# Patient Record
Sex: Female | Born: 1985 | Race: Black or African American | Hispanic: No | Marital: Single | Smoking: Never smoker
Health system: Southern US, Community
[De-identification: ages and names within clinical notes are randomized; demographics above are authoritative.]

## PROBLEM LIST (undated history)

## (undated) DIAGNOSIS — K449 Diaphragmatic hernia without obstruction or gangrene: Secondary | ICD-10-CM

## (undated) DIAGNOSIS — E669 Obesity, unspecified: Secondary | ICD-10-CM

## (undated) DIAGNOSIS — Z8742 Personal history of other diseases of the female genital tract: Secondary | ICD-10-CM

## (undated) DIAGNOSIS — E66811 Obesity, class 1: Secondary | ICD-10-CM

## (undated) DIAGNOSIS — O34219 Maternal care for unspecified type scar from previous cesarean delivery: Secondary | ICD-10-CM

## (undated) HISTORY — DX: Obesity, class 1: E66.811

## (undated) HISTORY — DX: Maternal care for unspecified type scar from previous cesarean delivery: O34.219

## (undated) HISTORY — PX: COLPOSCOPY: SHX161

## (undated) HISTORY — DX: Personal history of other diseases of the female genital tract: Z87.42

## (undated) HISTORY — DX: Diaphragmatic hernia without obstruction or gangrene: K44.9

## (undated) HISTORY — DX: Obesity, unspecified: E66.9

---

## 2006-03-30 ENCOUNTER — Emergency Department: Payer: Self-pay | Admitting: Internal Medicine

## 2006-09-20 ENCOUNTER — Emergency Department: Payer: Self-pay

## 2006-11-14 ENCOUNTER — Inpatient Hospital Stay: Payer: Self-pay | Admitting: Obstetrics and Gynecology

## 2006-11-14 DIAGNOSIS — O321XX0 Maternal care for breech presentation, not applicable or unspecified: Secondary | ICD-10-CM

## 2007-01-12 ENCOUNTER — Emergency Department: Payer: Self-pay | Admitting: Unknown Physician Specialty

## 2007-01-15 ENCOUNTER — Emergency Department: Payer: Self-pay | Admitting: Emergency Medicine

## 2012-01-02 ENCOUNTER — Emergency Department: Payer: Self-pay | Admitting: Unknown Physician Specialty

## 2012-05-05 ENCOUNTER — Emergency Department: Payer: Self-pay | Admitting: Emergency Medicine

## 2012-05-05 LAB — PREGNANCY, URINE: Pregnancy Test, Urine: NEGATIVE m[IU]/mL

## 2013-10-26 IMAGING — CT CT HEAD WITHOUT CONTRAST
2 series · 15 of 30 positions shown, 19 images · non-contrast
Comparison: none

REASON FOR EXAM: headache for 8 days, no prior headache like this   flex 4
COMMENTS:   LMP: Negative Beta HCG

[Series 2: without · axial · non-contrast · 0.40mm/px · z∈[+326,+446]mm · 13 of 28 slices shown, 17 images]
[im 2/28  brain]
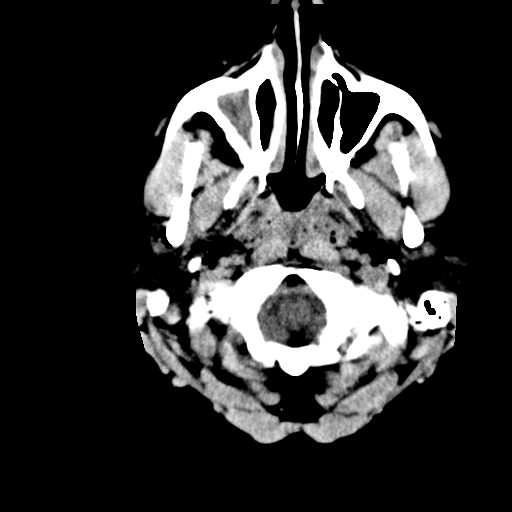
[im 2/28  bone]
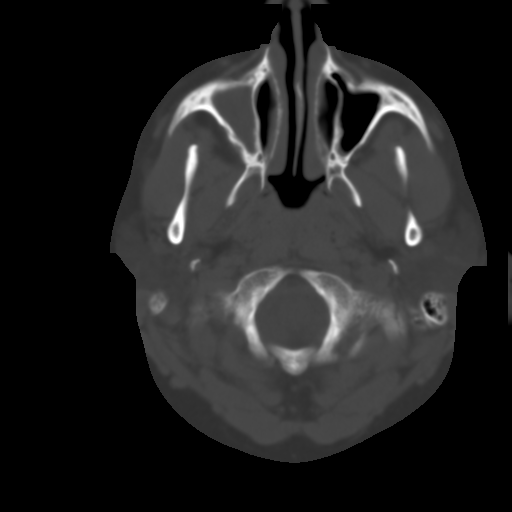
[im 4/28  brain]
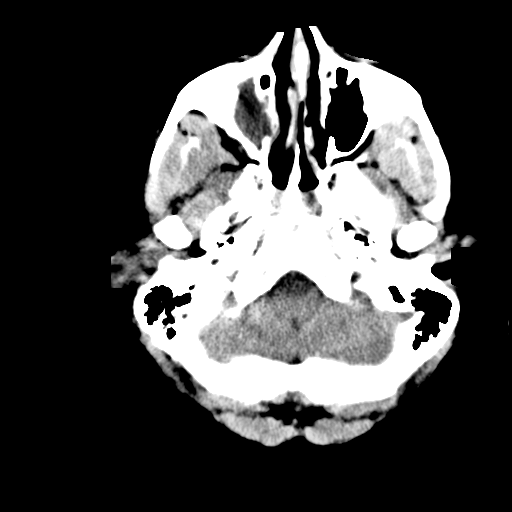
[im 6/28  brain]
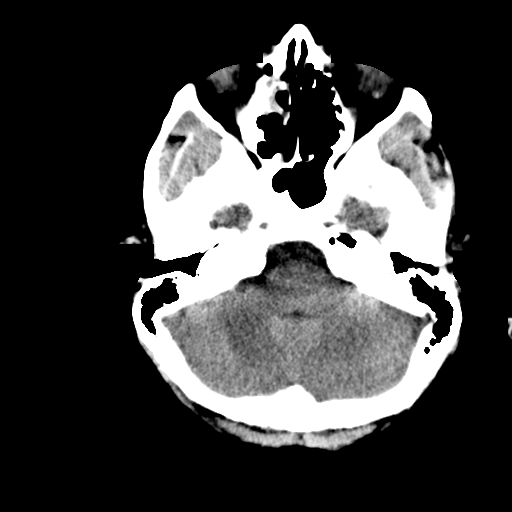
[im 8/28  brain]
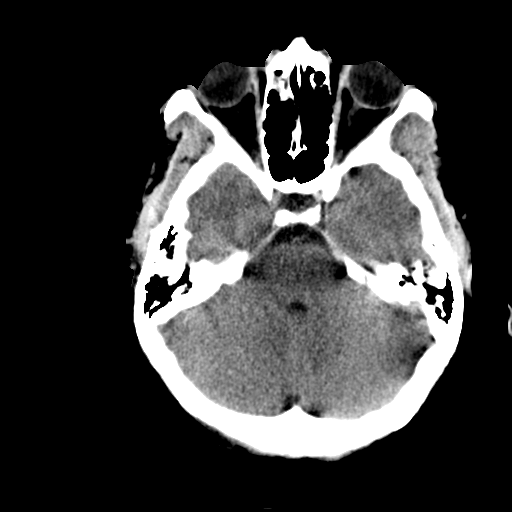
[im 10/28  brain]
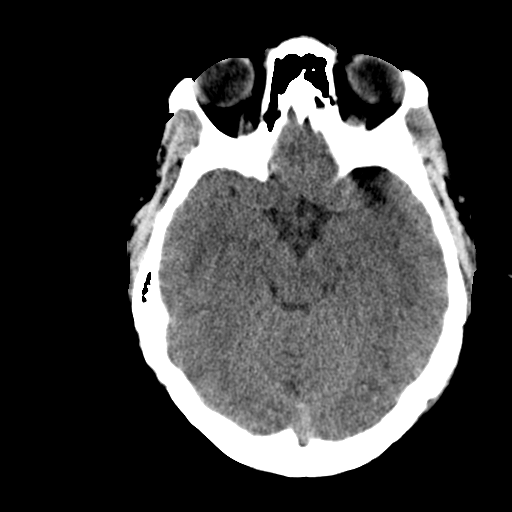
[im 10/28  bone]
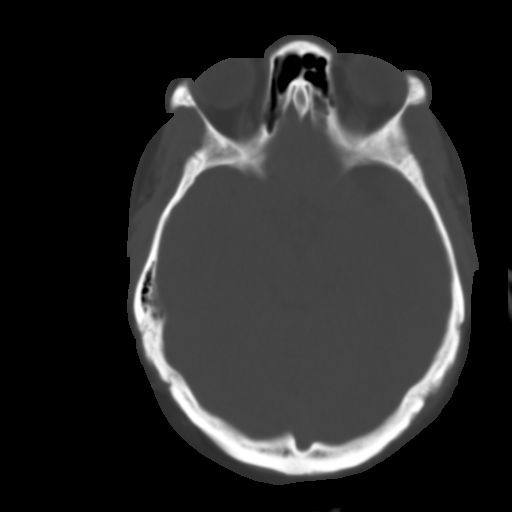
[im 12/28  brain]
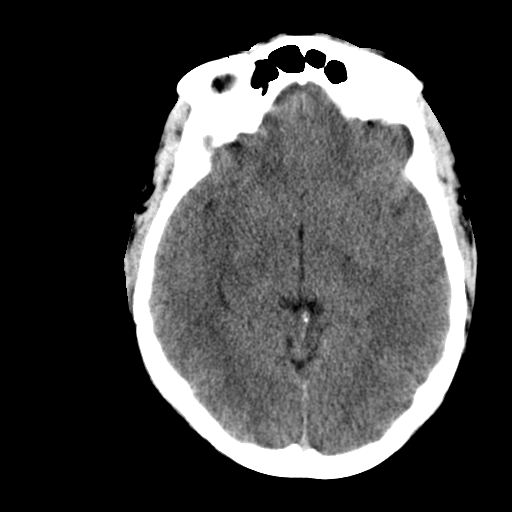
[im 14/28  brain]
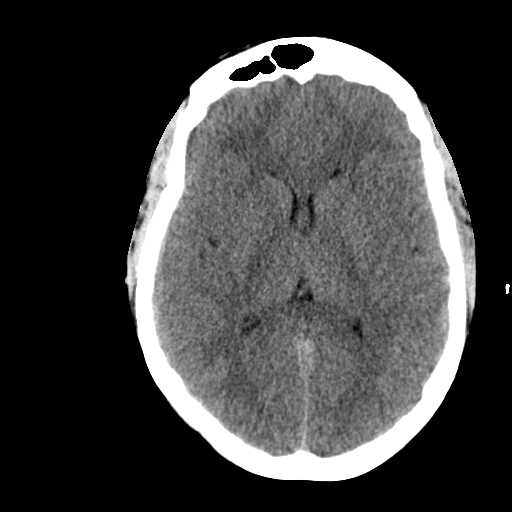
[im 16/28  brain]
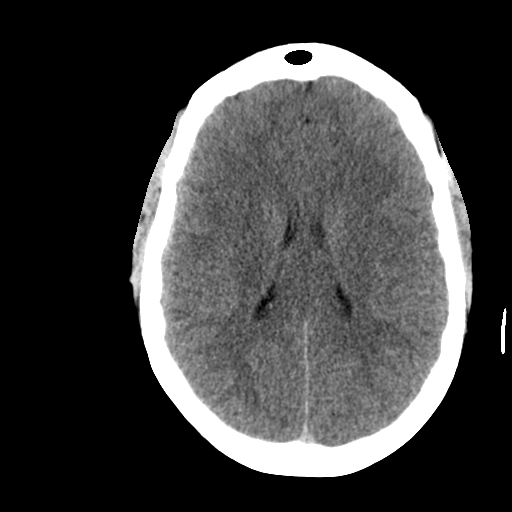
[im 18/28  brain]
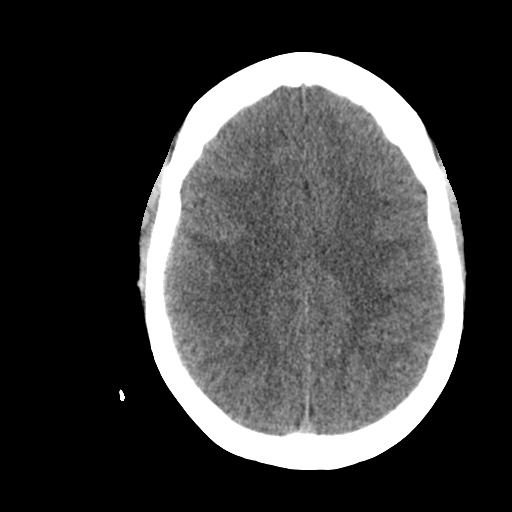
[im 18/28  bone]
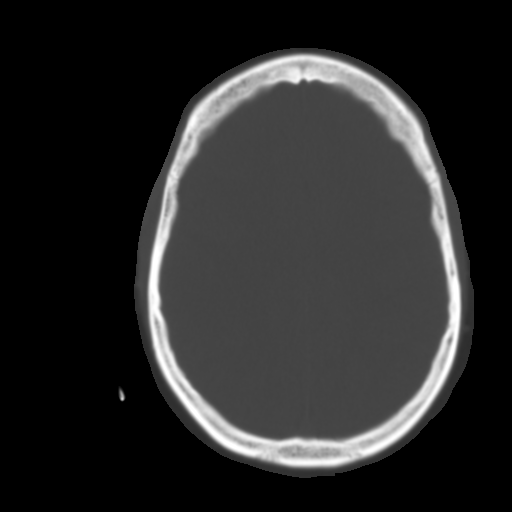
[im 20/28  brain]
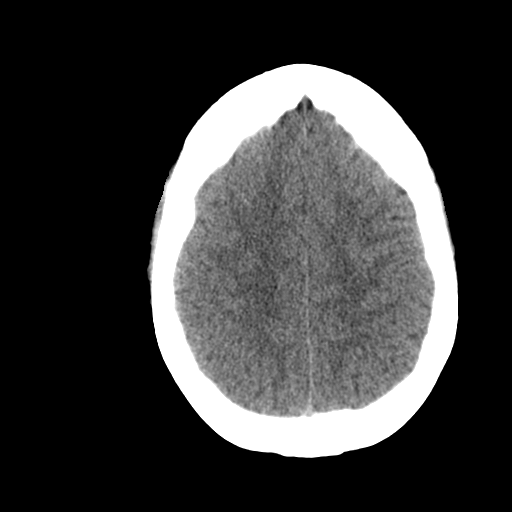
[im 22/28  brain]
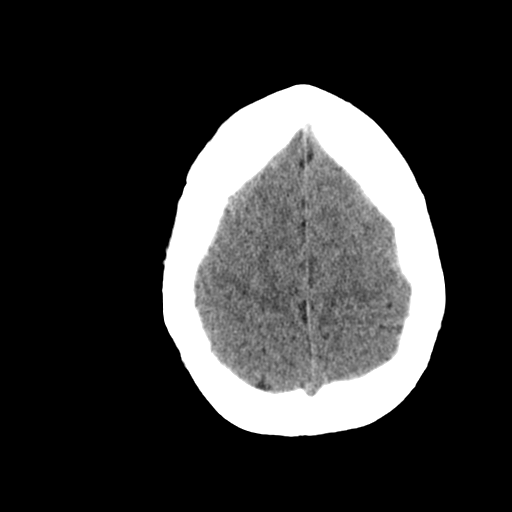
[im 24/28  brain]
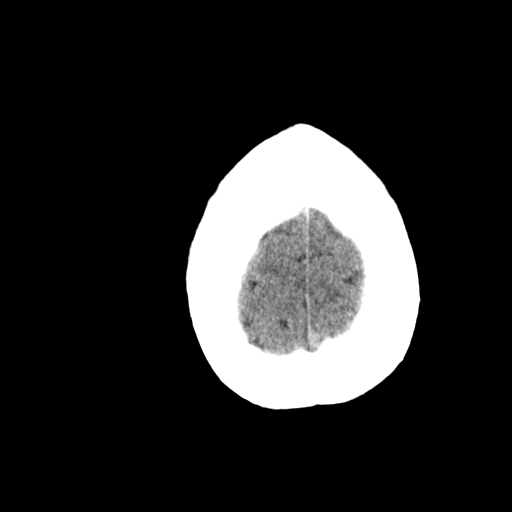
[im 26/28  brain]
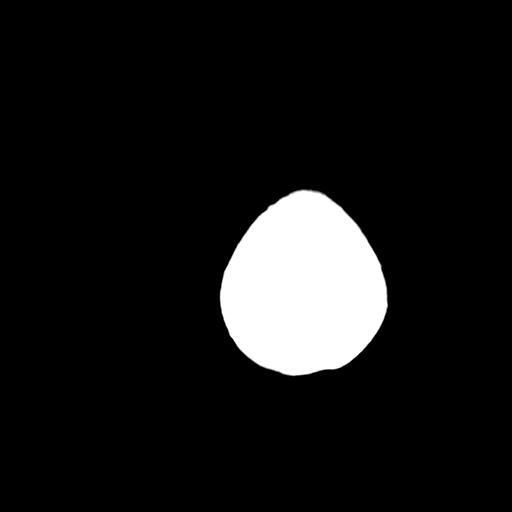
[im 26/28  bone]
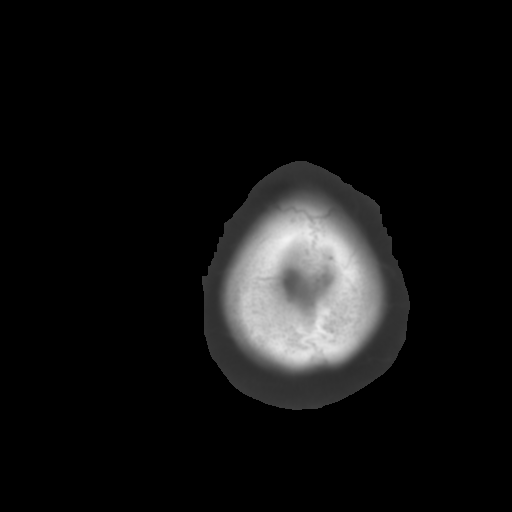

[Series 3: bone · axial · 0.40mm/px · z∈[+326,+346]mm · 2 of 28 slices shown]
[im 2/28  bone]
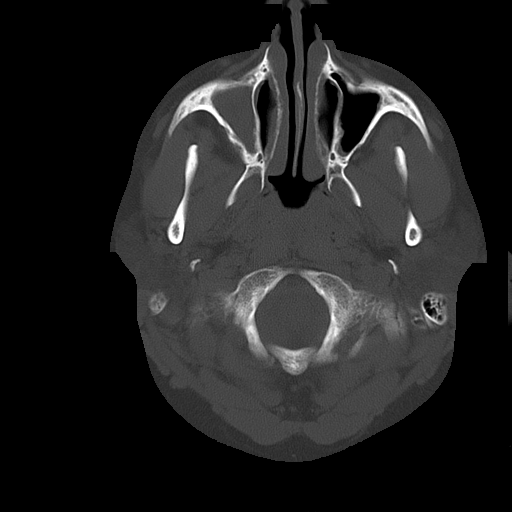
[im 6/28  bone]
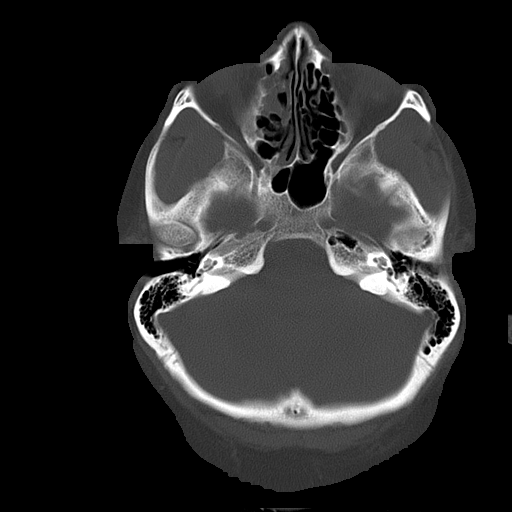

[15 of 30 positions shown; findings below may reference images not displayed]

PROCEDURE:     CT  - CT HEAD WITHOUT CONTRAST  - May 05, 2012  [DATE]

RESULT:     Emergent noncontrast CT of the brain is performed in the
standard fashion. The patient has no previous exam for comparison.

The ventricles and sulci appear to be normal. There is complete
opacification of the right maxillary sinus with some surrounding thickening
of the wall of the sinus suggestive of chronic sinusitis. Partial
opacification is seen in the right ethmoid region. The sphenoid sinuses and
frontal sinuses appear clear. The left maxillary sinus is clear. The mastoid
air cells show normal aeration. The calvarium is intact.

The ventricles and sulci are normal. There is no intracranial hemorrhage,
mass, mass effect or midline shift. There is no territorial infarct.
IMPRESSION: 1. Findings consistent with right maxillary sinusitis. Correlate clinically.
There slight thickening of the maxillary walls which suggest chronic
component.

[REDACTED]

## 2014-01-23 HISTORY — PX: ESOPHAGOGASTRODUODENOSCOPY: SHX1529

## 2014-10-13 DIAGNOSIS — R634 Abnormal weight loss: Secondary | ICD-10-CM | POA: Insufficient documentation

## 2015-11-26 HISTORY — PX: ABDOMINOPLASTY: SUR9

## 2017-08-19 ENCOUNTER — Ambulatory Visit: Payer: Self-pay | Admitting: Advanced Practice Midwife

## 2017-11-04 ENCOUNTER — Encounter: Payer: Self-pay | Admitting: Certified Nurse Midwife

## 2017-11-04 ENCOUNTER — Ambulatory Visit (INDEPENDENT_AMBULATORY_CARE_PROVIDER_SITE_OTHER): Payer: Commercial Managed Care - PPO | Admitting: Certified Nurse Midwife

## 2017-11-04 ENCOUNTER — Ambulatory Visit: Payer: Self-pay | Admitting: Certified Nurse Midwife

## 2017-11-04 VITALS — BP 112/72 | HR 63 | Ht 64.0 in | Wt 187.0 lb

## 2017-11-04 DIAGNOSIS — Z1329 Encounter for screening for other suspected endocrine disorder: Secondary | ICD-10-CM | POA: Diagnosis not present

## 2017-11-04 DIAGNOSIS — Z131 Encounter for screening for diabetes mellitus: Secondary | ICD-10-CM | POA: Diagnosis not present

## 2017-11-04 DIAGNOSIS — N852 Hypertrophy of uterus: Secondary | ICD-10-CM | POA: Diagnosis not present

## 2017-11-04 DIAGNOSIS — Z87898 Personal history of other specified conditions: Secondary | ICD-10-CM | POA: Diagnosis not present

## 2017-11-04 DIAGNOSIS — Z13220 Encounter for screening for lipoid disorders: Secondary | ICD-10-CM

## 2017-11-04 DIAGNOSIS — R87810 Cervical high risk human papillomavirus (HPV) DNA test positive: Secondary | ICD-10-CM

## 2017-11-04 DIAGNOSIS — K449 Diaphragmatic hernia without obstruction or gangrene: Secondary | ICD-10-CM | POA: Insufficient documentation

## 2017-11-04 DIAGNOSIS — Z01419 Encounter for gynecological examination (general) (routine) without abnormal findings: Secondary | ICD-10-CM | POA: Diagnosis not present

## 2017-11-04 DIAGNOSIS — R87610 Atypical squamous cells of undetermined significance on cytologic smear of cervix (ASC-US): Secondary | ICD-10-CM | POA: Insufficient documentation

## 2017-11-04 DIAGNOSIS — Z8742 Personal history of other diseases of the female genital tract: Secondary | ICD-10-CM | POA: Insufficient documentation

## 2017-11-04 NOTE — Progress Notes (Signed)
Gynecology Annual Exam  PCP: Patient, No Pcp Per  Chief Complaint:  Chief Complaint  Patient presents with  . Gynecologic Exam    History of Present Illness:History Of Present Illness  Ariana Waters presents today for her NP annual exam. She is a 31 year old Black/African American female , G3 P1021 , whose LMP was 11/03/2017. She is having no significant GYN problems. Her menses are regular. They occur every month , they last 6 days , are medium flow, and are without clots. She has had no spotting. She denies dysmenorrhea.   The patient's past medical history is remarkable for a history of a Cesarean section and an abdominoplasty She has had sex once in the last year. She uses nothing for contraception  Her most recent pap smear was obtained 06/19/2012 and was ASCUS with positive HRHPV. She has not had a colposcopy or another Pap smear since then Mammogram is not applicable. There is a positive family history of breast cancer in her maternal aunt. She does not think genetic testing was done on her aunt. There is no family history of ovarian cancer. The patient does not do monthly self breast exams.  The patient does not smoke.  The patient does drink infrequently.  The patient does not use illegal drugs.  The patient exercises regularly. She has lost 36# since her last annual. Current BMI is 32.10 kg/m2  The patient may not get adequate calcium in her diet. She is lactose intolerant She has not had a recent cholesterol screen and is interested in labwork.          Review of Systems: Review of Systems  Constitutional: Negative for chills, fever and weight loss.  HENT: Negative for congestion, sinus pain and sore throat.   Eyes: Negative for blurred vision and pain.  Respiratory: Negative for hemoptysis, shortness of breath and wheezing.   Cardiovascular: Negative for chest pain, palpitations and leg swelling.  Gastrointestinal: Negative for abdominal pain, blood in stool,  diarrhea, heartburn, nausea and vomiting.  Genitourinary: Negative for dysuria, frequency, hematuria and urgency.  Musculoskeletal: Negative for back pain, joint pain and myalgias.  Skin: Negative for itching and rash.  Neurological: Negative for dizziness, tingling and headaches.  Endo/Heme/Allergies: Negative for environmental allergies and polydipsia. Does not bruise/bleed easily.       Negative for hirsutism   Psychiatric/Behavioral: Negative for depression. The patient is not nervous/anxious and does not have insomnia.     Past Medical History:  Past Medical History:  Diagnosis Date  . Hiatal hernia   . History of abnormal cervical Pap smear 2010/ 2013    Past Surgical History:  Past Surgical History:  Procedure Laterality Date  . ABDOMINOPLASTY  2017  . CESAREAN SECTION  11/14/2006  . COLPOSCOPY  ?2010  . ESOPHAGOGASTRODUODENOSCOPY  01/2014    Family History:  Family History  Problem Relation Age of Onset  . Breast cancer Maternal Aunt 58  . Cancer Paternal Aunt        unknown primary/ paternal great aunt  . Thyroid disease Maternal Aunt   . Stroke Neg Hx     Social History:  Social History   Socioeconomic History  . Marital status: Single    Spouse name: Not on file  . Number of children: 1  . Years of education: Not on file  . Highest education level: Not on file  Social Needs  . Financial resource strain: Not on file  . Food insecurity - worry: Not  on file  . Food insecurity - inability: Not on file  . Transportation needs - medical: Not on file  . Transportation needs - non-medical: Not on file  Occupational History  . Occupation: assembly line  Tobacco Use  . Smoking status: Never Smoker  . Smokeless tobacco: Never Used  Substance and Sexual Activity  . Alcohol use: Yes    Frequency: Never    Comment: rare  . Drug use: No  . Sexual activity: Yes    Partners: Male    Birth control/protection: Abstinence  Other Topics Concern  . Not on file    Social History Narrative  . Not on file    Allergies:  Allergies  Allergen Reactions  . Penicillins Swelling    Swelling and hives on face  . Lemon Oil     Medications: none  Physical Exam Vitals: BP 112/72   Pulse 63   Ht 5\' 4"  (1.626 m)   Wt 187 lb (84.8 kg)   LMP 11/03/2017 (Exact Date)   BMI 32.10 kg/m .  General: AAF in NAD HEENT: normocephalic, anicteric Neck: no thyroid enlargement, no palpable nodules, no cervical lymphadenopathy  Pulmonary: No increased work of breathing, CTAB Cardiovascular: RRR, without murmur  Breast: Breast symmetrical, no tenderness, no palpable nodules or masses, no skin or nipple retraction present, no nipple discharge.  No axillary, infraclavicular or supraclavicular lymphadenopathy. Abdomen: Soft, non-tender, non-distended.  Umbilicus without lesions.  No hepatomegaly or masses palpable. No evidence of hernia. Genitourinary:  External: Normal external female genitalia.  Normal urethral meatus, normal Bartholin's and Skene's glands.    Vagina: moderate vaginal bleeding-on menses   Cervix: not visualized due to blood  Uterus: Anteverted, irregular shape, mobile, and non-tender  Adnexa: No adnexal masses, non-tender  Rectal: deferred  Lymphatic: no evidence of inguinal lymphadenopathy Extremities: no edema, erythema, or tenderness Neurologic: Grossly intact Psychiatric: mood appropriate, affect full     Assessment: 31 y.o. annual NP gyn exam Irregular shaped uterus R/O fibroids ASCUS Pap with HRHPV in 2013-last Pap  Unable to do Pap due to menses  Plan:  1) Breast cancer screening - recommend monthly self breast exam. Reviewed how to do self breast exam  2) Pelvic ultrasound in 2 weeks and follow up for Pap  3) Contraception - abstinence for now  4) Routine healthcare maintenance including cholesterol and diabetes screening ordered today, along with TSH  5) Discussed calcium and vitamin D3 requirements.  Ariana Waters  Ariana Waters, CNM

## 2017-11-04 NOTE — Patient Instructions (Signed)
Will see you back in a few weeks for a pelvic ultrasound and a PAP smear.

## 2017-11-05 LAB — TSH: TSH: 3.13 u[IU]/mL (ref 0.450–4.500)

## 2017-11-05 LAB — LIPID PANEL WITH LDL/HDL RATIO
CHOLESTEROL TOTAL: 160 mg/dL (ref 100–199)
HDL: 59 mg/dL (ref >39)
LDL Calculated: 89 mg/dL (ref 0–99)
LDl/HDL Ratio: 1.5 ratio (ref 0.0–3.2)
Triglycerides: 59 mg/dL (ref 0–149)
VLDL Cholesterol Cal: 12 mg/dL (ref 5–40)

## 2017-11-05 LAB — HGB A1C W/O EAG: Hgb A1c MFr Bld: 4.7 % — ABNORMAL LOW (ref 4.8–5.6)

## 2017-11-11 ENCOUNTER — Encounter (INDEPENDENT_AMBULATORY_CARE_PROVIDER_SITE_OTHER): Payer: Self-pay

## 2017-11-12 ENCOUNTER — Encounter: Payer: Self-pay | Admitting: Certified Nurse Midwife

## 2017-11-26 ENCOUNTER — Ambulatory Visit: Payer: Commercial Managed Care - PPO | Admitting: Certified Nurse Midwife

## 2017-11-26 ENCOUNTER — Other Ambulatory Visit: Payer: Commercial Managed Care - PPO

## 2017-11-26 ENCOUNTER — Encounter (INDEPENDENT_AMBULATORY_CARE_PROVIDER_SITE_OTHER): Payer: Self-pay

## 2017-12-31 ENCOUNTER — Encounter: Payer: Self-pay | Admitting: Certified Nurse Midwife

## 2017-12-31 ENCOUNTER — Ambulatory Visit (INDEPENDENT_AMBULATORY_CARE_PROVIDER_SITE_OTHER): Payer: Commercial Managed Care - PPO | Admitting: Certified Nurse Midwife

## 2017-12-31 ENCOUNTER — Ambulatory Visit (INDEPENDENT_AMBULATORY_CARE_PROVIDER_SITE_OTHER): Payer: Commercial Managed Care - PPO

## 2017-12-31 VITALS — BP 102/72 | HR 67 | Ht 64.0 in | Wt 176.0 lb

## 2017-12-31 DIAGNOSIS — R87810 Cervical high risk human papillomavirus (HPV) DNA test positive: Secondary | ICD-10-CM

## 2017-12-31 DIAGNOSIS — N852 Hypertrophy of uterus: Secondary | ICD-10-CM | POA: Diagnosis not present

## 2017-12-31 DIAGNOSIS — R87610 Atypical squamous cells of undetermined significance on cytologic smear of cervix (ASC-US): Secondary | ICD-10-CM | POA: Diagnosis not present

## 2017-12-31 DIAGNOSIS — Z124 Encounter for screening for malignant neoplasm of cervix: Secondary | ICD-10-CM

## 2017-12-31 NOTE — Progress Notes (Signed)
  HPI: 32 year old G1 70P1 female presents for follow up visit. She had her annual exam done in December and her uterus felt enlarged and irregular in shape. In addition she was unable to have her Pap done because she was on her menses. She is here today for a follow up after her pelvic ultrasound and to have her Pap done. Last Pap smear in 2013 was abnormal (ASCUS with positive HRHPV).  Ultrasound demonstrates a uterus measuring 9.4 x 5.5 x 5.2 cm. No fibroids were seen. No ovarian masses were seen.   PMHx: She  has a past medical history of Hiatal hernia and History of abnormal cervical Pap smear (2010/ 2013). Also,  has a past surgical history that includes Colposcopy (?2010); Cesarean section (11/14/2006); Abdominoplasty (2017); and Esophagogastroduodenoscopy (01/2014)., family history includes Breast cancer (age of onset: 6758) in her maternal aunt; Cancer in her paternal aunt; Thyroid disease in her maternal aunt.,  reports that  has never smoked. she has never used smokeless tobacco. She reports that she drinks alcohol. She reports that she does not use drugs.  She has a current medication list which includes the following prescription(s): multi-vitamins. Also, is allergic to penicillins and lemon oil.  ROS  Objective: BP 102/72   Pulse 67   Ht 5\' 4"  (1.626 m)   Wt 176 lb (79.8 kg)   LMP 12/20/2017 (Exact Date)   BMI 30.21 kg/m   Physical examination Constitutional NAD, Conversant      Extremities: Moves all appropriately.  Normal ROM for age. No lymphadenopathy.  Neuro: Grossly intact  Psych: Oriented to PPT.  Normal mood. Normal affect.  Pelvic:  Vulva: no lesions or inflammation Vagina: small amount off white mucoid discharge Cervix: small, no lesions, nullip Pap done   Assessment/ Plan: normal pelvic ultrasound Hx of abnormal Pap smear  Pap done  RTO 1 year if Pap smear is normal.  Farrel Connersolleen Season Astacio, CNM

## 2018-01-03 LAB — IGP, APTIMA HPV
HPV Aptima: POSITIVE — AB
PAP SMEAR COMMENT: 0

## 2018-01-07 ENCOUNTER — Encounter: Payer: Self-pay | Admitting: Certified Nurse Midwife

## 2018-01-07 ENCOUNTER — Telehealth: Payer: Self-pay | Admitting: Certified Nurse Midwife

## 2018-01-07 DIAGNOSIS — R87810 Cervical high risk human papillomavirus (HPV) DNA test positive: Principal | ICD-10-CM

## 2018-01-07 DIAGNOSIS — R87610 Atypical squamous cells of undetermined significance on cytologic smear of cervix (ASC-US): Secondary | ICD-10-CM

## 2018-01-07 DIAGNOSIS — R87612 Low grade squamous intraepithelial lesion on cytologic smear of cervix (LGSIL): Secondary | ICD-10-CM

## 2018-01-07 NOTE — Telephone Encounter (Signed)
Patient called with results of Pap smear: LGSIL with positive HRHPV. Prior PAP ASCUS with positive HRHPV. Recommended colposcopy after discussing findings. Will schedule with Dr Jerene PitchSchuman. Farrel Connersolleen Phelan Goers, CNM

## 2018-02-20 ENCOUNTER — Encounter: Payer: Self-pay | Admitting: Obstetrics and Gynecology

## 2018-02-20 ENCOUNTER — Ambulatory Visit (INDEPENDENT_AMBULATORY_CARE_PROVIDER_SITE_OTHER): Payer: Commercial Managed Care - PPO | Admitting: Obstetrics and Gynecology

## 2018-02-20 VITALS — BP 100/60 | HR 73 | Ht 64.0 in | Wt 179.0 lb

## 2018-02-20 DIAGNOSIS — Z87898 Personal history of other specified conditions: Secondary | ICD-10-CM

## 2018-02-20 DIAGNOSIS — Z8742 Personal history of other diseases of the female genital tract: Secondary | ICD-10-CM

## 2018-02-20 DIAGNOSIS — R87612 Low grade squamous intraepithelial lesion on cytologic smear of cervix (LGSIL): Secondary | ICD-10-CM | POA: Diagnosis not present

## 2018-02-20 NOTE — Progress Notes (Signed)
   GYNECOLOGY CLINIC COLPOSCOPY PROCEDURE NOTE  32 y.o. [id number] here for colposcopy for low-grade squamous intraepithelial neoplasia (LGSIL - encompassing HPV,mild dysplasia,CIN I)  pap smear on 12/31/2017. Discussed underlying role for HPV infection in the development of cervical dysplasia, its natural history and progression/regression, need for surveillance.  Is the patient  pregnant: No LMP: Patient's last menstrual period was 02/12/2018. Smoking status:  reports that she has never smoked. She has never used smokeless tobacco. Number of partners in lifetime:  Less than 10 Future fertility desired:  Yes  Patient given informed consent, signed copy in the chart, time out was performed.  The patient was position in dorsal lithotomy position. Speculum was placed the cervix was visualized.   After application of acetic acid colposcopic inspection of the cervix was undertaken.   Colposcopy adequate, full visualization of transformation zone: Yes Mosaic changes at 2 and 9 o'clock and abnormal vessels noted at 9 o'clock; corresponding biopsies obtained.   ECC specimen obtained:  Yes  All specimens were labeled and sent to pathology.   Patient was given post procedure instructions.  Will follow up pathology and manage accordingly.  Routine preventative health maintenance measures emphasized.  Physical Exam  Genitourinary:      Ariana Idlerhristanna Vaness Jelinski MD [facility], [facility] 02/20/18 2:55 PM

## 2018-02-24 ENCOUNTER — Encounter: Payer: Self-pay | Admitting: Obstetrics and Gynecology

## 2018-02-24 LAB — PATHOLOGY

## 2018-02-24 NOTE — Progress Notes (Signed)
No CIN. Repeat pap with cotesting in 1 year. Called, no answer, left message to check mychart.

## 2018-03-16 ENCOUNTER — Ambulatory Visit: Payer: Commercial Managed Care - PPO | Admitting: Obstetrics and Gynecology

## 2018-04-21 ENCOUNTER — Ambulatory Visit (INDEPENDENT_AMBULATORY_CARE_PROVIDER_SITE_OTHER): Payer: Commercial Managed Care - PPO | Admitting: Certified Nurse Midwife

## 2018-04-21 ENCOUNTER — Other Ambulatory Visit (INDEPENDENT_AMBULATORY_CARE_PROVIDER_SITE_OTHER): Payer: Commercial Managed Care - PPO

## 2018-04-21 ENCOUNTER — Encounter: Payer: Self-pay | Admitting: Certified Nurse Midwife

## 2018-04-21 VITALS — BP 100/60 | Ht 64.0 in | Wt 185.0 lb

## 2018-04-21 DIAGNOSIS — E669 Obesity, unspecified: Secondary | ICD-10-CM

## 2018-04-21 DIAGNOSIS — Z3480 Encounter for supervision of other normal pregnancy, unspecified trimester: Secondary | ICD-10-CM

## 2018-04-21 DIAGNOSIS — Z3201 Encounter for pregnancy test, result positive: Secondary | ICD-10-CM | POA: Diagnosis not present

## 2018-04-21 DIAGNOSIS — Z113 Encounter for screening for infections with a predominantly sexual mode of transmission: Secondary | ICD-10-CM

## 2018-04-21 DIAGNOSIS — O3680X0 Pregnancy with inconclusive fetal viability, not applicable or unspecified: Secondary | ICD-10-CM

## 2018-04-21 DIAGNOSIS — Z3A01 Less than 8 weeks gestation of pregnancy: Secondary | ICD-10-CM | POA: Diagnosis not present

## 2018-04-21 DIAGNOSIS — N926 Irregular menstruation, unspecified: Secondary | ICD-10-CM

## 2018-04-21 DIAGNOSIS — R87612 Low grade squamous intraepithelial lesion on cytologic smear of cervix (LGSIL): Secondary | ICD-10-CM

## 2018-04-21 DIAGNOSIS — Z9889 Other specified postprocedural states: Secondary | ICD-10-CM | POA: Insufficient documentation

## 2018-04-21 DIAGNOSIS — O34219 Maternal care for unspecified type scar from previous cesarean delivery: Secondary | ICD-10-CM

## 2018-04-21 LAB — POCT URINE PREGNANCY: Preg Test, Ur: POSITIVE — AB

## 2018-04-21 LAB — OB RESULTS CONSOLE VARICELLA ZOSTER ANTIBODY, IGG: Varicella: IMMUNE

## 2018-04-21 NOTE — Progress Notes (Signed)
New Obstetric Patient H&P    Chief Complaint: "Desires prenatal care"   History of Present Illness: Patient is a 32 y.o. G62P1021 Black female, LMP 03/03/2018 presents with amenorrhea and positive home pregnancy test. Based on her  LMP, her EDD is  12/08/18 and her EGA is [redacted]w[redacted]d. Cycles are 6 days, regular, and occur approximately every : 28 days. Her last pap smear was 3 or 4 months ago and was LGSIL with positive HRHPV. A colposcopy and biops wa done 02/20/2018 and pathology returned +HPV.Marland Kitchen    She had a urine pregnancy test which was positive 1 month(s)  ago. Her last menstrual period was normal and lasted for  6 day(s). Since her LMP she claims she has experienced breast tenderness and urinary frequency. She denies vaginal bleeding. Her past medical history is remarkable for obesity with a BMI=31-32, a history of an abdominoplasty, and a history of a hiatal hernia . Her prior pregnancies are notable for a previous Cesarean section for breech presentation at term.  Since her LMP, she admits to the use of tobacco products  No Since her LMP, she denies use of alcohol and non prescription drugs. She claims she has gained   9 pounds since the start of her pregnancy.  There are cats in the home in the home  no If yes NA She admits close contact with children on a regular basis  yes  She has had chicken pox in the past yes She has had Tuberculosis exposures, symptoms, or previously tested positive for TB   no Current or past history of domestic violence. no  Genetic Screening/Teratology Counseling: (Includes patient, baby's father, or anyone in either family with:)   1. Patient's age >/= 58 at [facility]  no 2. Thalassemia ([place], [place], Mediterranean, or Asian background): MCV<80  no 3. Neural tube defect (meningomyelocele, spina bifida, anencephaly)  no 4. Congenital heart defect  no  5. Down syndrome  no 6. Tay-Sachs (Jewish, [place]  no 7. Canavan's Disease  no 8. Sickle cell  disease or trait (African)  no  9. Hemophilia or other blood disorders  no  10. Muscular dystrophy  no  11. Cystic fibrosis  no  12. Huntington's Chorea  no  13. Mental retardation/autism  no 14. Other inherited genetic or chromosomal disorder  no 15. Maternal metabolic disorder (DM, PKU, etc)  no 16. Patient or FOB with a child with a birth defect not listed above no  16a. Patient or FOB with a birth defect themselves no 17. Recurrent pregnancy loss, or stillbirth  no  18. Any medications since LMP other than prenatal vitamins (include vitamins, supplements, OTC meds, drugs, alcohol)  Yes, Tylenol 19. Any other genetic/environmental exposure to discuss  no  Infection History:   1. Lives with someone with TB or TB exposed  no  2. Patient or partner has history of genital herpes  no 3. Rash or viral illness since LMP  no 4. History of STI (GC, CT, HPV, syphilis, HIV)  no 5. History of recent travel :  no  Other pertinent information:  Yes, patient works at [employer] on the [employer]. Her partner is Chrissie Noa, age 37     Review of Systems: Review of Systems  Constitutional: Positive for malaise/fatigue. Negative for chills, fever and weight loss.  HENT: Negative for congestion, sinus pain and sore throat.   Eyes: Negative for blurred vision and pain.  Respiratory: Negative for hemoptysis, shortness of breath and  wheezing.   Cardiovascular: Negative for chest pain, palpitations and leg swelling.  Gastrointestinal: Negative for abdominal pain, blood in stool, diarrhea, heartburn, nausea and vomiting.  Genitourinary: Positive for frequency. Negative for dysuria, hematuria and urgency.  Musculoskeletal: Negative for back pain, joint pain and myalgias.  Skin: Negative for itching and rash.  Neurological: Positive for headaches. Negative for dizziness and tingling.  Endo/Heme/Allergies: Negative for environmental allergies and polydipsia. Does not bruise/bleed easily.       Negative for  hirsutism. Positive for breast tenderness   Psychiatric/Behavioral: Negative for depression. The patient is not nervous/anxious and does not have insomnia.    Past Medical History:  Past Medical History:  Diagnosis Date  . Hiatal hernia   . History of abnormal cervical Pap smear 2010/ 2013    Past Surgical History:  Past Surgical History:  Procedure Laterality Date  . ABDOMINOPLASTY  2017  . CESAREAN SECTION  11/14/2006  . COLPOSCOPY  ?2010  . ESOPHAGOGASTRODUODENOSCOPY  01/2014    Gynecologic History: Patient's last menstrual period was 03/03/2018 (exact date).  Obstetric History: [id number] OB History  Gravida Para Term Preterm AB Living  SAB TAB Ectopic Multiple Live Births    2     1    # Outcome Date GA Lbr Len/2nd Weight Sex Delivery Anes PTL Lv  4 Current           3 Term 11/14/06 [redacted]w[redacted]d  6 lb (2.722 kg) M CS-LTranv   LIV     Complications: Breech presentation  2 TAB           1 TAB            Family History:  Family History  Problem Relation Age of Onset  . Breast cancer Maternal Aunt 58  . Cancer Paternal Aunt        unknown primary/ paternal great aunt  . Thyroid disease Maternal Aunt   . Stroke Neg Hx     Social History:  Social History   Socioeconomic History  . Marital status: Single    Spouse name: Not on file  . Number of children: 1  . Years of education: Not on file  . Highest education level: Not on file  Occupational History  . Occupation: Theatre stage manager  Social Needs  . Financial resource strain: Not on file  . Food insecurity:    Worry: Not on file    Inability: Not on file  . Transportation needs:    Medical: Not on file    Non-medical: Not on file  Tobacco Use  . Smoking status: Never Smoker  . Smokeless tobacco: Never Used  Substance and Sexual Activity  . Alcohol use: Yes    Frequency: Never    Comment: rare  . Drug use: No  . Sexual activity: Yes    Partners: Male    Birth control/protection: None  Lifestyle   . Physical activity:    Days per week: 4 days    Minutes per session: 110 min  . Stress: Rather much  Relationships  . Social connections:    Talks on phone: More than three times a week    Gets together: Once a week    Attends religious service: More than 4 times per year    Active member of club or organization: No    Attends meetings of clubs or organizations: Never    Relationship status: Never married  . Intimate partner violence:  Fear of current or ex partner: No    Emotionally abused: No    Physically abused: No    Forced sexual activity: No  Other Topics Concern  . Not on file  Social History Narrative  . Not on file    Allergies:  Allergies  Allergen Reactions  . Penicillins Swelling    Swelling and hives on face  . Lemon Oil Rash    Medications: Current Outpatient Medications on File Prior to Visit  Medication Sig Dispense Refill  . Multiple Vitamin (MULTI-VITAMINS) TABS Take by mouth.     No current facility-administered medications on file prior to visit.   Physical Exam Vitals: Blood pressure 100/60, weight 185 lb (83.9 kg), last menstrual period 03/03/2018.  General: BF in NAD HEENT: normocephalic, anicteric Thyroid: no enlargement, no palpable nodules Pulmonary: No increased work of breathing, CTAB Cardiovascular: RRR without murmur Abdomen: soft, non-tender, non-distended.  Umbilicus without lesions.  No hepatomegaly, or masses palpable. No evidence of hernia. Large scar on lower abdomen and around umbilicus from abdominoplasty Genitourinary:  External: Normal external female genitalia.  Normal urethral meatus, normal Bartholin's and Skene's glands.    Vagina: Normal vaginal mucosa, no evidence of prolapse.    Cervix: no bleeding, closed, NT  Uterus: AF, 6-8 week size, mobile, normal contour, NT  Adnexa: ovaries non-enlarged, no adnexal masses  Rectal: deferred Extremities: no edema, erythema, or tenderness Neurologic: Grossly  intact Psychiatric: mood appropriate, affect full   Assessment: 32 y.o. [id number] at [redacted]w[redacted]d presenting to initiate prenatal care Prior CS for breech presentation. Obesity (BMI 31-32 kg/m2) Hx of abnormal Pap smear  Plan: 1) Avoid alcoholic beverages. 2) Patient encouraged not to smoke.  3) Discontinue the use of all non-medicinal drugs and chemicals.  4) Take prenatal vitamins daily.  5) Nutrition, food safety (fish, cheese advisories, and high nitrite foods) and exercise discussed. Handouts given. 6) Hospital and practice style discussed with cross coverage system.  7) Genetic Screening, such as with 1st Trimester Screening, cell free fetal DNA, AFP testing, and Ultrasound, as well as carrier screening is discussed with patient. At the conclusion of today's visit patient requested genetic testing: most likely will do first trimester testing.  8) Patient is asked about travel to areas at risk for the Zika virus, and counseled to avoid travel and exposure to mosquitoes or sexual partners who may have themselves been exposed to the virus. Testing is discussed, and will be ordered as appropriate. 9) A TVUS for viability/ dating was done today: There was a singleton IUP with CRL=7wk, which confirms dates. FCA 141  10.) Discussed history of previous Cesarean section for breech presentation and options for trial of labor vs repeat Cesarean section. Explained risks of uterine rupture with a spontaneous labor (<1%) and the risk increases with induction of labor, especially with an unripe cervix. Risk of CS probably similar to first time mother (20-25%).  11) RTO in 3-4 weeks for DT check Schedule 1 hour GTT at that time. NOB labs today.Repeat Pap at 6 week pp check.  Farrel Conners, CNM

## 2018-04-22 LAB — URINE DRUG PANEL 7
AMPHETAMINES, URINE: NEGATIVE ng/mL
BENZODIAZEPINE QUANT UR: NEGATIVE ng/mL
Barbiturate Quant, Ur: NEGATIVE ng/mL
COCAINE (METAB.): NEGATIVE ng/mL
Cannabinoid Quant, Ur: NEGATIVE ng/mL
OPIATE QUANT UR: NEGATIVE ng/mL
PCP Quant, Ur: NEGATIVE ng/mL

## 2018-04-23 LAB — RPR+RH+ABO+RUB AB+AB SCR+CB...
Antibody Screen: NEGATIVE
HIV Screen 4th Generation wRfx: NONREACTIVE
Hematocrit: 36.4 % (ref 34.0–46.6)
Hemoglobin: 11.1 g/dL (ref 11.1–15.9)
Hepatitis B Surface Ag: NEGATIVE
MCH: 27.6 pg (ref 26.6–33.0)
MCHC: 30.5 g/dL — ABNORMAL LOW (ref 31.5–35.7)
MCV: 91 fL (ref 79–97)
Platelets: 273 10*3/uL (ref 150–450)
RBC: 4.02 x10E6/uL (ref 3.77–5.28)
RDW: 14.6 % (ref 12.3–15.4)
RPR Ser Ql: NONREACTIVE
RUBELLA: 1.15 {index} (ref >0.99)
Rh Factor: POSITIVE
VARICELLA: 1012 {index} (ref >165)
WBC: 9.2 10*3/uL (ref 3.4–10.8)

## 2018-04-23 LAB — HEMOGLOBINOPATHY EVALUATION
HEMOGLOBIN F QUANTITATION: 0 % (ref 0.0–2.0)
HGB A: 97.8 % (ref 96.4–98.8)
HGB C: 0 %
HGB S: 0 %
HGB VARIANT: 0 %
Hemoglobin A2 Quantitation: 2.2 % (ref 1.8–3.2)

## 2018-04-23 LAB — CHLAMYDIA/GONOCOCCUS/TRICHOMONAS, NAA
CHLAMYDIA BY NAA: NEGATIVE
GONOCOCCUS BY NAA: NEGATIVE
TRICH VAG BY NAA: NEGATIVE

## 2018-04-23 LAB — URINE CULTURE

## 2018-04-24 ENCOUNTER — Encounter: Payer: Self-pay | Admitting: Certified Nurse Midwife

## 2018-04-24 DIAGNOSIS — O0990 Supervision of high risk pregnancy, unspecified, unspecified trimester: Secondary | ICD-10-CM | POA: Insufficient documentation

## 2018-04-27 ENCOUNTER — Encounter: Payer: Self-pay | Admitting: Certified Nurse Midwife

## 2018-04-29 ENCOUNTER — Encounter: Payer: Self-pay | Admitting: Certified Nurse Midwife

## 2018-05-19 ENCOUNTER — Encounter: Payer: Self-pay | Admitting: Obstetrics and Gynecology

## 2018-05-19 ENCOUNTER — Ambulatory Visit (INDEPENDENT_AMBULATORY_CARE_PROVIDER_SITE_OTHER): Payer: Commercial Managed Care - PPO | Admitting: Obstetrics and Gynecology

## 2018-05-19 VITALS — BP 104/58 | Wt 194.0 lb

## 2018-05-19 DIAGNOSIS — O34219 Maternal care for unspecified type scar from previous cesarean delivery: Secondary | ICD-10-CM

## 2018-05-19 DIAGNOSIS — E669 Obesity, unspecified: Secondary | ICD-10-CM

## 2018-05-19 DIAGNOSIS — Z3A11 11 weeks gestation of pregnancy: Secondary | ICD-10-CM

## 2018-05-19 DIAGNOSIS — Z3491 Encounter for supervision of normal pregnancy, unspecified, first trimester: Secondary | ICD-10-CM

## 2018-05-19 DIAGNOSIS — E66811 Obesity, class 1: Secondary | ICD-10-CM

## 2018-05-19 NOTE — Progress Notes (Incomplete)
  Routine Prenatal Care Visit  Subjective  Ariana Waters is a 32 y.o. [phone] at Waters being seen today for ongoing prenatal care.  She is currently monitored for the following issues for this {Blank single:19197::"high-risk","low-risk"} pregnancy and has Atypical squamous cell changes of undetermined significance (ASCUS) on cervical cytology with positive high risk human papilloma virus (HPV); History of abnormal cervical Pap smear; Hiatal hernia; LGSIL on Pap smear of cervix; Previous cesarean delivery, antepartum condition or complication; Obesity (BMI 30.0-34.9); and Supervision of low-risk pregnancy on their problem list.  ----------------------------------------------------------------------------------- Patient reports {sx:14538}.    . Vag. Bleeding: None.   . Denies leaking of fluid.  ----------------------------------------------------------------------------------- The following portions of the patient's history were reviewed and updated as appropriate: allergies, current medications, past family history, past medical history, past social history, past surgical history and problem list. Problem list updated.   Objective  Blood pressure (!) 104/58, weight 194 lb (88 kg), last menstrual period 03/03/2018. Pregravid weight 176 lb (79.8 kg) Total Weight Gain 18 lb (8.165 kg) Urinalysis: Urine Protein: Negative Urine Glucose: Negative  Fetal Status: Fetal Heart Rate (bpm): 155         General:  Alert, oriented and cooperative. Patient is in no acute distress.  Skin: Skin is warm and dry. No rash noted.   Cardiovascular: Normal heart rate noted  Respiratory: Normal respiratory effort, no problems with respiration noted  Abdomen: Soft, gravid, appropriate for gestational age.       Pelvic:  {Blank single:19197::"Cervical exam performed","Cervical exam deferred"}        Extremities: Normal range of motion.     Mental Status: Normal mood and affect. Normal behavior. Normal judgment  and thought content.   Assessment   31 y.o. G2X5284G4P1021 at 2472w0d by  12/08/2018, by Last Menstrual Period presenting for {Blank single:19197::"routine","work-in"} prenatal visit  Plan   pregnancy #2 Problems (from 04/21/18 to present)    No problems associated with this episode.       {Blank single:19197::"Term","Preterm"} labor symptoms and general obstetric precautions including but not limited to vaginal bleeding, contractions, leaking of fluid and fetal movement were reviewed in detail with the patient. Please refer to After Visit Summary for other counseling recommendations.   Return for schedule U/S for NT and lab for 1h gtt and ROB.  Thomasene MohairStephen Estephanie Hubbs, MD, Merlinda FrederickFACOG [facility], [facility] 05/19/2018 8:46 AM

## 2018-05-19 NOTE — Progress Notes (Signed)
No complaints

## 2018-05-19 NOTE — Progress Notes (Signed)
  Routine Prenatal Care Visit  Subjective  Ariana Waters is a 32 y.o. [phone] at 9071w0d being seen today for ongoing prenatal care.  She is currently monitored for the following issues for this high-risk pregnancy and has Atypical squamous cell changes of undetermined significance (ASCUS) on cervical cytology with positive high risk human papilloma virus (HPV); History of abnormal cervical Pap smear; Hiatal hernia; LGSIL on Pap smear of cervix; Previous cesarean delivery, antepartum condition or complication; Obesity (BMI 30.0-34.9); and Supervision of low-risk pregnancy on their problem list.  ----------------------------------------------------------------------------------- Patient reports no complaints.    . Vag. Bleeding: None.   . Denies leaking of fluid.  ----------------------------------------------------------------------------------- The following portions of the patient's history were reviewed and updated as appropriate: allergies, current medications, past family history, past medical history, past social history, past surgical history and problem list. Problem list updated.   Objective  Blood pressure (!) 104/58, weight 194 lb (88 kg), last menstrual period 03/03/2018. Pregravid weight 176 lb (79.8 kg) Total Weight Gain 18 lb (8.165 kg) Urinalysis: Urine Protein: Negative Urine Glucose: Negative  Fetal Status: Fetal Heart Rate (bpm): 155         General:  Alert, oriented and cooperative. Patient is in no acute distress.  Skin: Skin is warm and dry. No rash noted.   Cardiovascular: Normal heart rate noted  Respiratory: Normal respiratory effort, no problems with respiration noted  Abdomen: Soft, gravid, appropriate for gestational age.       Pelvic:  Cervical exam deferred        Extremities: Normal range of motion.     Mental Status: Normal mood and affect. Normal behavior. Normal judgment and thought content.   Assessment   32 y.o. [id number] at 6171w0d by  12/08/2018, by  Last Menstrual Period presenting for routine prenatal visit  Plan   pregnancy #2 Problems (from 04/21/18 to present)    No problems associated with this episode.       Preterm labor symptoms and general obstetric precautions including but not limited to vaginal bleeding, contractions, leaking of fluid and fetal movement were reviewed in detail with the patient. Please refer to After Visit Summary for other counseling recommendations.   Return for schedule U/S for NT and lab for 1h gtt and ROB.  Thomasene MohairStephen Verner Mccrone, MD, Merlinda FrederickFACOG [facility], [facility] 05/19/2018 8:46 AM

## 2018-05-25 ENCOUNTER — Other Ambulatory Visit: Payer: Commercial Managed Care - PPO

## 2018-05-25 ENCOUNTER — Encounter: Payer: Self-pay | Admitting: Advanced Practice Midwife

## 2018-05-25 ENCOUNTER — Ambulatory Visit (INDEPENDENT_AMBULATORY_CARE_PROVIDER_SITE_OTHER): Payer: Commercial Managed Care - PPO | Admitting: Advanced Practice Midwife

## 2018-05-25 VITALS — BP 124/76 | Wt 188.0 lb

## 2018-05-25 DIAGNOSIS — Z3491 Encounter for supervision of normal pregnancy, unspecified, first trimester: Secondary | ICD-10-CM

## 2018-05-25 DIAGNOSIS — Z3A11 11 weeks gestation of pregnancy: Secondary | ICD-10-CM

## 2018-05-25 DIAGNOSIS — O34219 Maternal care for unspecified type scar from previous cesarean delivery: Secondary | ICD-10-CM

## 2018-05-25 DIAGNOSIS — E669 Obesity, unspecified: Secondary | ICD-10-CM

## 2018-05-25 NOTE — Patient Instructions (Signed)

## 2018-05-25 NOTE — Progress Notes (Signed)
  Routine Prenatal Care Visit  Subjective  Ariana Waters is a 32 y.o. [phone] at 3831w6d being seen today for ongoing prenatal care.  She is currently monitored for the following issues for this high-risk pregnancy and has Atypical squamous cell changes of undetermined significance (ASCUS) on cervical cytology with positive high risk human papilloma virus (HPV); History of abnormal cervical Pap smear; Hiatal hernia; LGSIL on Pap smear of cervix; Previous cesarean delivery, antepartum condition or complication; Obesity (BMI 30.0-34.9); Supervision of low-risk pregnancy; and Weight loss on their problem list.  ----------------------------------------------------------------------------------- Patient reports no complaints.  No ultrasound tech today. Patient has difficulty getting here prior to 4:30 due to work and will need late appointment next week for NT scan.  .  .  Denies vaginal bleeding. Denies leaking of fluid.  ----------------------------------------------------------------------------------- The following portions of the patient's history were reviewed and updated as appropriate: allergies, current medications, past family history, past medical history, past social history, past surgical history and problem list. Problem list updated.   Objective  Blood pressure 124/76, weight 188 lb (85.3 kg), last menstrual period 03/03/2018. Pregravid weight 176 lb (79.8 kg) Total Weight Gain 12 lb (5.443 kg) Urinalysis:      Fetal Status: Fetal Heart Rate (bpm): 156         General:  Alert, oriented and cooperative. Patient is in no acute distress.  Skin: Skin is warm and dry. No rash noted.   Cardiovascular: Normal heart rate noted  Respiratory: Normal respiratory effort, no problems with respiration noted  Abdomen: Soft, gravid, appropriate for gestational age.       Pelvic:  Cervical exam deferred        Extremities: Normal range of motion.     Mental Status: Normal mood and affect.  Normal behavior. Normal judgment and thought content.   Assessment   31 y.o. [id number] at 831w6d by  12/08/2018, by Last Menstrual Period presenting for routine prenatal visit  Plan   pregnancy #2 Problems (from 04/21/18 to present)    No problems associated with this episode.       Preterm labor symptoms and general obstetric precautions including but not limited to vaginal bleeding, contractions, leaking of fluid and fetal movement were reviewed in detail with the patient. Please refer to After Visit Summary for other counseling recommendations.   Return in about 1 week (around 06/01/2018) for NT scan and rob- as late in day as possible for work schedule.  Tresea MallJane Geramy Lamorte, CNM 05/25/2018 3:10 PM

## 2018-05-25 NOTE — Progress Notes (Signed)
ROB Early GTT R/S NT d/t no U/S tech

## 2018-05-26 LAB — GLUCOSE, 1 HOUR GESTATIONAL: Gestational Diabetes Screen: 97 mg/dL (ref 65–139)

## 2018-06-03 ENCOUNTER — Ambulatory Visit (INDEPENDENT_AMBULATORY_CARE_PROVIDER_SITE_OTHER): Payer: Commercial Managed Care - PPO | Admitting: Obstetrics and Gynecology

## 2018-06-03 ENCOUNTER — Ambulatory Visit (INDEPENDENT_AMBULATORY_CARE_PROVIDER_SITE_OTHER): Payer: Commercial Managed Care - PPO

## 2018-06-03 ENCOUNTER — Encounter: Payer: Self-pay | Admitting: Obstetrics and Gynecology

## 2018-06-03 VITALS — BP 110/64 | Wt 192.0 lb

## 2018-06-03 DIAGNOSIS — O3680X0 Pregnancy with inconclusive fetal viability, not applicable or unspecified: Secondary | ICD-10-CM | POA: Diagnosis not present

## 2018-06-03 DIAGNOSIS — O99211 Obesity complicating pregnancy, first trimester: Secondary | ICD-10-CM

## 2018-06-03 DIAGNOSIS — Z3A13 13 weeks gestation of pregnancy: Secondary | ICD-10-CM

## 2018-06-03 DIAGNOSIS — Z3491 Encounter for supervision of normal pregnancy, unspecified, first trimester: Secondary | ICD-10-CM

## 2018-06-03 DIAGNOSIS — O34219 Maternal care for unspecified type scar from previous cesarean delivery: Secondary | ICD-10-CM

## 2018-06-03 DIAGNOSIS — E66811 Obesity, class 1: Secondary | ICD-10-CM

## 2018-06-03 DIAGNOSIS — E669 Obesity, unspecified: Secondary | ICD-10-CM

## 2018-06-03 DIAGNOSIS — O0991 Supervision of high risk pregnancy, unspecified, first trimester: Secondary | ICD-10-CM

## 2018-06-03 DIAGNOSIS — Z1379 Encounter for other screening for genetic and chromosomal anomalies: Secondary | ICD-10-CM

## 2018-06-03 NOTE — Progress Notes (Signed)
Routine Prenatal Care Visit  Subjective  Ariana Waters is a 32 y.o. [phone] at [redacted]w[redacted]d being seen today for ongoing prenatal care.  She is currently monitored for the following issues for this high-risk pregnancy and has Atypical squamous cell changes of undetermined significance (ASCUS) on cervical cytology with positive high risk human papilloma virus (HPV); History of abnormal cervical Pap smear; Hiatal hernia; LGSIL on Pap smear of cervix; Previous cesarean delivery, antepartum condition or complication; Obesity (BMI 30.0-34.9); Supervision of low-risk pregnancy; and Weight loss on their problem list.  ----------------------------------------------------------------------------------- Patient reports no complaints.    . Vag. Bleeding: None.   . Denies leaking of fluid.  NT u/s normal today. ----------------------------------------------------------------------------------- The following portions of the patient's history were reviewed and updated as appropriate: allergies, current medications, past family history, past medical history, past social history, past surgical history and problem list. Problem list updated.   Objective  Blood pressure 110/64, weight 192 lb (87.1 kg), last menstrual period 03/03/2018. Pregravid weight 176 lb (79.8 kg) Total Weight Gain 16 lb (7.258 kg) Urinalysis: Urine Protein: Negative Urine Glucose: Negative  Fetal Status: Fetal Heart Rate (bpm): present         General:  Alert, oriented and cooperative. Patient is in no acute distress.  Skin: Skin is warm and dry. No rash noted.   Cardiovascular: Normal heart rate noted  Respiratory: Normal respiratory effort, no problems with respiration noted  Abdomen: Soft, gravid, appropriate for gestational age.       Pelvic:  Cervical exam deferred        Extremities: Normal range of motion.     Mental Status: Normal mood and affect. Normal behavior. Normal judgment and thought content.   Imaging: US Fetal  Nuchal Translucency Measurement  Result Date: 06/03/2018 ULTRASOUND REPORT Location: [facility] Date of Service: 06/03/2018 Patient Name: Ariana Waters DOB: 11/18/1986 MRN: [id number] Indications:First Trimester Screen - NT Findings: Mason Jim intrauterine pregnancy is visualized with a CRL consistent with [redacted]w[redacted]d  gestation, giving an (U/S) EDD of 12/06/2018. The (U/S) EDD is consistent with the clinically established Estimated Date of Delivery: 12/08/18. FHR: 159 bpm CRL measurement: 71.9 mm NT measurement: 1.8 mm. Yolk sac and and early anatomy is normal. Amnion is visualized. Nasal Bone is Present Right Ovary is normal in appearance. Left Ovary is normal in appearance. Survey of the adnexa demonstrates no adnexal masses. There is no free peritoneal fluid in the cul de sac. Impression: 1. [redacted]w[redacted]d viable Singleton Intrauterine pregnancy by U/S. 2. (U/S) EDD is consistent with Clinically established Estimated Date of Delivery: 12/08/18. 3. NT Screen is successfully completed. Recommendations: 1.Clinical correlation with the patient's History and Physical Exam. Mital bahen P Patel, RDMS There is a viable  singleton gestation.  The fetal biometry correlates with established dating Detailed evaluation of the fetal anatomy is precluded by early gestational age.The NT measurement is less than 3mm.    It must be noted that a normal ultrasound is unable to definitively rule out fetal aneuploidy.  Thomasene Mohair, MD, [facility], [facility] 06/03/2018, 5:18 PM.     Assessment   31 y.o. [id number] at [redacted]w[redacted]d by  12/08/2018, by Last Menstrual Period presenting for routine prenatal visit  Plan   pregnancy #2 Problems (from 04/21/18 to present)    Problem Noted Resolved   Supervision of high risk pregnancy, antepartum 04/24/2018 by Farrel Conners, CNM No   Overview Addendum 06/03/2018  5:37 PM by Conard Novak, MD  Clinic [facility]  Dating  Blood type: A/Positive/--  (05/28 1045)   Genetic Screen 1 Screen: pend    AFP:     NIPS: Antibody:Negative (05/28 1045)  Anatomic US  Rubella: 1.15 (05/28 1045)  Varicella: VI  GTT Early: 97 Third trimester:  RPR: Non Reactive (05/28 1045)   Rhogam  HBsAg: Negative (05/28 1045) neg  TDaP vaccine                       Flu Shot: HIV: Non Reactive (05/28 1045)   Baby Food                                GBS:   Contraception  Pap:  CBB   AA hemoglobin  CS/VBAC    Support Person           Previous cesarean delivery, antepartum condition or complication 04/21/2018 by Farrel ConnersGutierrez, Colleen, CNM No   Obesity (BMI 30.0-34.9) 04/21/2018 by Farrel ConnersGutierrez, Colleen, CNM No     Preterm labor symptoms and general obstetric precautions including but not limited to vaginal bleeding, contractions, leaking of fluid and fetal movement were reviewed in detail with the patient. Please refer to After Visit Summary for other counseling recommendations.  -NT screen today  Return in about 1 month (around 07/01/2018) for Routine Prenatal Appointment.  Thomasene MohairStephen Jackson, MD, Merlinda FrederickFACOG [facility], [facility] 06/03/2018 5:35 PM

## 2018-06-03 NOTE — Progress Notes (Incomplete)
  Routine Prenatal Care Visit  Subjective  Ariana Waters is a 32 y.o. [phone] at 8225w1d being seen today for ongoing prenatal care.  She is currently monitored for the following issues for this {Blank single:19197::"high-risk","low-risk"} pregnancy and has Atypical squamous cell changes of undetermined significance (ASCUS) on cervical cytology with positive high risk human papilloma virus (HPV); History of abnormal cervical Pap smear; Hiatal hernia; LGSIL on Pap smear of cervix; Previous cesarean delivery, antepartum condition or complication; Obesity (BMI 30.0-34.9); Supervision of low-risk pregnancy; and Weight loss on their problem list.  ----------------------------------------------------------------------------------- Patient reports {sx:14538}.    . Vag. Bleeding: None.   . Denies leaking of fluid.  ----------------------------------------------------------------------------------- The following portions of the patient's history were reviewed and updated as appropriate: allergies, current medications, past family history, past medical history, past social history, past surgical history and problem list. Problem list updated.   Objective  Blood pressure 110/64, weight 192 lb (87.1 kg), last menstrual period 03/03/2018. Pregravid weight 176 lb (79.8 kg) Total Weight Gain 16 lb (7.258 kg) Urinalysis: Urine Protein: Negative Urine Glucose: Negative  Fetal Status: Fetal Heart Rate (bpm): present         General:  Alert, oriented and cooperative. Patient is in no acute distress.  Skin: Skin is warm and dry. No rash noted.   Cardiovascular: Normal heart rate noted  Respiratory: Normal respiratory effort, no problems with respiration noted  Abdomen: Soft, gravid, appropriate for gestational age.       Pelvic:  {Blank single:19197::"Cervical exam performed","Cervical exam deferred"}        Extremities: Normal range of motion.     Mental Status: Normal mood and affect. Normal behavior.  Normal judgment and thought content.   Assessment   31 y.o. [id number] at 5825w1d by  12/08/2018, by Last Menstrual Period presenting for {Blank single:19197::"routine","work-in"} prenatal visit  Plan   pregnancy #2 Problems (from 04/21/18 to present)    No problems associated with this episode.       {Blank single:19197::"Term","Preterm"} labor symptoms and general obstetric precautions including but not limited to vaginal bleeding, contractions, leaking of fluid and fetal movement were reviewed in detail with the patient. Please refer to After Visit Summary for other counseling recommendations.   Return in about 1 month (around 07/01/2018) for Routine Prenatal Appointment.  Thomasene MohairStephen Seabron Iannello, MD, Merlinda FrederickFACOG [facility], [facility] 06/03/2018 5:35 PM

## 2018-06-06 LAB — FIRST TRIMESTER SCREEN W/NT
CRL: 71.9 mm
DIA MoM: 0.78
DIA Value: 148.5 pg/mL
GEST AGE-COLLECT: 13.1 wk
HCG VALUE: 59.3 [IU]/mL
MATERNAL AGE AT EDD: 31.5 a
NUCHAL TRANSLUCENCY MOM: 1.02
NUCHAL TRANSLUCENCY: 1.8 mm
NUMBER OF FETUSES: 1
PAPP-A MOM: 1.21
PAPP-A Value: 1036.9 ng/mL
Test Results:: NEGATIVE
WEIGHT: 192 [lb_av]
hCG MoM: 0.82

## 2018-07-01 ENCOUNTER — Ambulatory Visit (INDEPENDENT_AMBULATORY_CARE_PROVIDER_SITE_OTHER): Payer: Commercial Managed Care - PPO | Admitting: Obstetrics and Gynecology

## 2018-07-01 ENCOUNTER — Encounter: Payer: Self-pay | Admitting: Obstetrics and Gynecology

## 2018-07-01 VITALS — BP 112/72 | Wt 196.0 lb

## 2018-07-01 DIAGNOSIS — O0990 Supervision of high risk pregnancy, unspecified, unspecified trimester: Secondary | ICD-10-CM

## 2018-07-01 DIAGNOSIS — O34219 Maternal care for unspecified type scar from previous cesarean delivery: Secondary | ICD-10-CM

## 2018-07-01 DIAGNOSIS — O99210 Obesity complicating pregnancy, unspecified trimester: Secondary | ICD-10-CM | POA: Insufficient documentation

## 2018-07-01 DIAGNOSIS — O99212 Obesity complicating pregnancy, second trimester: Secondary | ICD-10-CM

## 2018-07-01 DIAGNOSIS — Z3A17 17 weeks gestation of pregnancy: Secondary | ICD-10-CM

## 2018-07-01 DIAGNOSIS — Z6830 Body mass index (BMI) 30.0-30.9, adult: Secondary | ICD-10-CM

## 2018-07-01 NOTE — Progress Notes (Incomplete)
Routine Prenatal Care Visit  Subjective  Ariana Waters is a 32 y.o. [phone] at 146w1d being seen today for ongoing prenatal care.  She is currently monitored for the following issues for this {Blank single:19197::"high-risk","low-risk"} pregnancy and has Atypical squamous cell changes of undetermined significance (ASCUS) on cervical cytology with positive high risk human papilloma virus (HPV); History of abnormal cervical Pap smear; Hiatal hernia; LGSIL on Pap smear of cervix; Previous cesarean delivery, antepartum condition or complication; Obesity (BMI 30.0-34.9); Supervision of high risk pregnancy, antepartum; Weight loss; Obesity affecting pregnancy; and BMI 30.0-30.9,adult on their problem list.  ----------------------------------------------------------------------------------- Patient reports {sx:14538}.   Contractions: Not present. Vag. Bleeding: None.  Movement: Absent. Denies leaking of fluid.  ----------------------------------------------------------------------------------- The following portions of the patient's history were reviewed and updated as appropriate: allergies, current medications, past family history, past medical history, past social history, past surgical history and problem list. Problem list updated.   Objective  Blood pressure 112/72, weight 196 lb (88.9 kg), last menstrual period 03/03/2018. Pregravid weight 176 lb (79.8 kg) Total Weight Gain 20 lb (9.072 kg) Urinalysis: Urine Protein: Negative Urine Glucose: Negative  Fetal Status: Fetal Heart Rate (bpm): 145   Movement: Absent     General:  Alert, oriented and cooperative. Patient is in no acute distress.  Skin: Skin is warm and dry. No rash noted.   Cardiovascular: Normal heart rate noted  Respiratory: Normal respiratory effort, no problems with respiration noted  Abdomen: Soft, gravid, appropriate for gestational age.       Pelvic:  {Blank single:19197::"Cervical exam performed","Cervical exam  deferred"}        Extremities: Normal range of motion.     Mental Status: Normal mood and affect. Normal behavior. Normal judgment and thought content.   Assessment   32 y.o. [id number] at 6746w1d by  12/08/2018, by Last Menstrual Period presenting for {Blank single:19197::"routine","work-in"} prenatal visit  Plan   pregnancy #2 Problems (from 04/21/18 to present)    Problem Noted Resolved   Obesity affecting pregnancy 07/01/2018 by Conard NovakJackson, Cheyenne Schumm D, MD No   BMI 30.0-30.9,adult 07/01/2018 by Conard NovakJackson, Ousman Dise D, MD No   Supervision of high risk pregnancy, antepartum 04/24/2018 by Farrel ConnersGutierrez, Colleen, CNM No   Overview Addendum 06/03/2018  5:37 PM by Conard NovakJackson, Khadeejah Castner D, MD    [facility] P[facility]atal [facility]  Dating  Blood type: A/Positive/-- (05/28 1045)   Genetic Screen 1 Screen: pend    AFP:     NIPS: Antibody:Negative (05/28 1045)  Anatomic US  Rubella: 1.15 (05/28 1045)  Varicella: VI  GTT Early: 97 Third trimester:  RPR: Non Reactive (05/28 1045)   Rhogam  HBsAg: Negative (05/28 1045) neg  TDaP vaccine                       Flu Shot: HIV: Non Reactive (05/28 1045)   Baby Food                                GBS:   Contraception  Pap:  CBB   AA hemoglobin  CS/VBAC    Support Person           Previous cesarean delivery, antepartum condition or complication 04/21/2018 by Farrel ConnersGutierrez, Colleen, CNM No   Obesity (BMI 30.0-34.9) 04/21/2018 by Farrel ConnersGutierrez, Colleen, CNM No       {Blank single:19197::"Term","Preterm"} labor symptoms and general obstetric precautions including but not limited to vaginal bleeding, contractions, leaking  of fluid and fetal movement were reviewed in detail with the patient. Please refer to After Visit Summary for other counseling recommendations.   Return in about 2 weeks (around 07/15/2018) for schedule anatomy u/s and routine prenatal after.  Thomasene Mohair, MD, [facility], [facility] 07/01/2018 5:17 PM

## 2018-07-01 NOTE — Progress Notes (Signed)
Routine Prenatal Care Visit  Subjective  Ariana Waters is a 32 y.o. [phone] at [redacted]w[redacted]d being seen today for ongoing prenatal care.  She is currently monitored for the following issues for this high-risk pregnancy and has Atypical squamous cell changes of undetermined significance (ASCUS) on cervical cytology with positive high risk human papilloma virus (HPV); History of abnormal cervical Pap smear; Hiatal hernia; LGSIL on Pap smear of cervix; Previous cesarean delivery, antepartum condition or complication; Obesity (BMI 30.0-34.9); Supervision of high risk pregnancy, antepartum; Weight loss; Obesity affecting pregnancy; and BMI 30.0-30.9,adult on their problem list.  ----------------------------------------------------------------------------------- Patient reports no complaints.   Contractions: Not present. Vag. Bleeding: None.  Movement: Absent. Denies leaking of fluid.  ----------------------------------------------------------------------------------- The following portions of the patient's history were reviewed and updated as appropriate: allergies, current medications, past family history, past medical history, past social history, past surgical history and problem list. Problem list updated.   Objective  Blood pressure 112/72, weight 196 lb (88.9 kg), last menstrual period 03/03/2018. Pregravid weight 176 lb (79.8 kg) Total Weight Gain 20 lb (9.072 kg) Urinalysis: Urine Protein: Negative Urine Glucose: Negative  Fetal Status: Fetal Heart Rate (bpm): 145   Movement: Absent     General:  Alert, oriented and cooperative. Patient is in no acute distress.  Skin: Skin is warm and dry. No rash noted.   Cardiovascular: Normal heart rate noted  Respiratory: Normal respiratory effort, no problems with respiration noted  Abdomen: Soft, gravid, appropriate for gestational age.       Pelvic:  Cervical exam deferred        Extremities: Normal range of motion.     Mental Status: Normal mood  and affect. Normal behavior. Normal judgment and thought content.   Assessment   32 y.o. [id number] at [redacted]w[redacted]d by  12/08/2018, by Last Menstrual Period presenting for routine prenatal visit  Plan   pregnancy #2 Problems (from 04/21/18 to present)    Problem Noted Resolved   Obesity affecting pregnancy 07/01/2018 by Conard Novak, MD No   BMI 30.0-30.9,adult 07/01/2018 by Conard Novak, MD No   Supervision of high risk pregnancy, antepartum 04/24/2018 by Farrel Conners, CNM No   Overview Addendum 06/03/2018  5:37 PM by Conard Novak, MD    [facility]  Dating  Blood type: A/Positive/-- (05/28 1045)   Genetic Screen 1 Screen: pend    AFP:     NIPS: Antibody:Negative (05/28 1045)  Anatomic [place]  Rubella: 1.15 (05/28 1045)  Varicella: VI  GTT Early: 97 Third trimester:  RPR: Non Reactive (05/28 1045)   Rhogam  HBsAg: Negative (05/28 1045) neg  TDaP vaccine                       Flu Shot: HIV: Non Reactive (05/28 1045)   Baby Food                                GBS:   Contraception  Pap:  CBB   AA hemoglobin  CS/VBAC    Support Person           Previous cesarean delivery, antepartum condition or complication 04/21/2018 by Farrel Conners, CNM No   Obesity (BMI 30.0-34.9) 04/21/2018 by Farrel Conners, CNM No       Preterm labor symptoms and general obstetric precautions including but not limited to vaginal bleeding, contractions, leaking of fluid and fetal movement  were reviewed in detail with the patient. Please refer to After Visit Summary for other counseling recommendations.   Return in about 2 weeks (around 07/15/2018) for schedule anatomy u/s and routine prenatal after.  Thomasene MohairStephen Thai Hemrick, MD, Merlinda FrederickFACOG [facility], [facility] 07/01/2018 5:17 PM

## 2018-07-20 ENCOUNTER — Ambulatory Visit (INDEPENDENT_AMBULATORY_CARE_PROVIDER_SITE_OTHER): Payer: Commercial Managed Care - PPO

## 2018-07-20 ENCOUNTER — Ambulatory Visit (INDEPENDENT_AMBULATORY_CARE_PROVIDER_SITE_OTHER): Payer: Commercial Managed Care - PPO | Admitting: Obstetrics and Gynecology

## 2018-07-20 ENCOUNTER — Encounter: Payer: Self-pay | Admitting: Obstetrics and Gynecology

## 2018-07-20 VITALS — BP 110/70 | Wt 197.0 lb

## 2018-07-20 DIAGNOSIS — F5101 Primary insomnia: Secondary | ICD-10-CM

## 2018-07-20 DIAGNOSIS — Z363 Encounter for antenatal screening for malformations: Secondary | ICD-10-CM | POA: Diagnosis not present

## 2018-07-20 DIAGNOSIS — Z3A19 19 weeks gestation of pregnancy: Secondary | ICD-10-CM

## 2018-07-20 DIAGNOSIS — O0990 Supervision of high risk pregnancy, unspecified, unspecified trimester: Secondary | ICD-10-CM

## 2018-07-20 DIAGNOSIS — O99212 Obesity complicating pregnancy, second trimester: Secondary | ICD-10-CM

## 2018-07-20 DIAGNOSIS — O34219 Maternal care for unspecified type scar from previous cesarean delivery: Secondary | ICD-10-CM

## 2018-07-20 MED ORDER — ZOLPIDEM TARTRATE 5 MG/ACT PO SOLN: 5.0000 mg | Freq: Every evening | ORAL | 0 refills | Status: DC | PRN

## 2018-07-20 NOTE — Progress Notes (Addendum)
Routine Prenatal Care Visit  Subjective  Ariana Waters is a 32 y.o. [phone] at [redacted]w[redacted]d being seen today for ongoing prenatal care.  She is currently monitored for the following issues for this high-risk pregnancy and has Atypical squamous cell changes of undetermined significance (ASCUS) on cervical cytology with positive high risk human papilloma virus (HPV); History of abnormal cervical Pap smear; Hiatal hernia; LGSIL on Pap smear of cervix; Previous cesarean delivery, antepartum condition or complication; Obesity (BMI 30.0-34.9); Supervision of high risk pregnancy, antepartum; Weight loss; Obesity affecting pregnancy; and BMI 30.0-30.9,adult on their problem list.  ----------------------------------------------------------------------------------- Patient reports no complaints.   Contractions: Not present. Vag. Bleeding: None.  Movement: Absent. Denies leaking of fluid.  ----------------------------------------------------------------------------------- The following portions of the patient's history were reviewed and updated as appropriate: allergies, current medications, past family history, past medical history, past social history, past surgical history and problem list. Problem list updated.   Objective  Blood pressure 110/70, weight 197 lb (89.4 kg), last menstrual period 03/03/2018. Pregravid weight 176 lb (79.8 kg) Total Weight Gain 21 lb (9.526 kg) Urinalysis:      Fetal Status: Fetal Heart Rate (bpm): 155   Movement: Absent  Presentation: Complete Breech  General:  Alert, oriented and cooperative. Patient is in no acute distress.  Skin: Skin is warm and dry. No rash noted.   Cardiovascular: Normal heart rate noted  Respiratory: Normal respiratory effort, no problems with respiration noted  Abdomen: Soft, gravid, appropriate for gestational age. Pain/Pressure: Absent     Pelvic:  Cervical exam deferred        Extremities: Normal range of motion.  Edema: None  ental  Status: Normal mood and affect. Normal behavior. Normal judgment and thought content.   US Ob Comp + 14 Wk  Result Date: 07/20/2018 ULTRASOUND REPORT Patient Name: Ariana Waters DOB: 1985/12/26 MRN: [id number] Location: [facility] Date of Service: 07/20/2018 Indications:Anatomy Ultrasound Findings: Mason Jim intrauterine pregnancy is visualized with FHR at 159 BPM. Biometrics give an (U/S) Gestational age of [redacted]w[redacted]d and an (U/S) EDD of 12/07/2018; this correlates with the clinically established Estimated Date of Delivery: 12/08/18 Fetal presentation is Breech. EFW: 11oz. Placenta: Anterior Fundal, Grade 0. AFI: subjectively normal. Anatomic survey is incomplete for RVOT, 4-CH and suboptimal L/S Spine and normal; Gender - female.  Right Ovary is normal in appearance. Left Ovary is normal appearance. Survey of the adnexa demonstrates no adnexal masses. There is no free peritoneal fluid in the cul de sac. Impression: 1. [redacted]w[redacted]d Viable Singleton Intrauterine pregnancy by U/S. 2. (U/S) EDD is consistent with Clinically established Estimated Date of Delivery: 12/08/18 . 3. Normal Anatomy Scan Recommendations: 1.Clinical correlation with the patient's History and Physical Exam. Willette Alma, RDMS, RVT I have reviewed this ultrasound and the report. I agree with the above assessment and plan. Adelene Idler MD [facility] 07/20/18 10:36 AM     Assessment   32 y.o. [id number] at [redacted]w[redacted]d by  12/08/2018, by Last Menstrual Period presenting for routine prenatal visit  Plan   pregnancy #2 Problems (from 04/21/18 to present)    Problem Noted Resolved   Obesity affecting pregnancy 07/01/2018 by Conard Novak, MD No   BMI 30.0-30.9,adult 07/01/2018 by Conard Novak, MD No   Supervision of high risk pregnancy, antepartum 04/24/2018 by Farrel Conners, CNM No   Overview Addendum 07/20/2018  7:48 AM by Natale Milch, MD    Clinic [facility] Prenatal Labs  Dating  LMP=7wk US Blood type: A/Positive/-- (05/28 1045)   Genetic Screen 1 Screen:  normal Antibody:Negative (05/28 1045)  Anatomic US  Rubella: 1.15 (05/28 1045)  Varicella: VI  GTT Early: 97 Third trimester:  RPR: Non Reactive (05/28 1045)   Rhogam Not applicable HBsAg: Negative (05/28 1045) neg  TDaP vaccine                        Flu Shot: HIV: Non Reactive (05/28 1045)   Baby Food                                GBS:   Contraception  Pap: 2019 LSIL HPV +  CBB   AA hemoglobin  CS/VBAC    Support Person           Previous cesarean delivery, antepartum condition or complication 04/21/2018 by Farrel ConnersGutierrez, Colleen, CNM No   Obesity (BMI 30.0-34.9) 04/21/2018 by Farrel ConnersGutierrez, Colleen, CNM No       Gestational age appropriate obstetric precautions including but not limited to vaginal bleeding, contractions, leaking of fluid and fetal movement were reviewed in detail with the patient.    Anatomy US incomplete, repeat at next visit Discussed VBAC vs. Repeat cesarean, patient's preference at this time is repeat cesarean section.  Discussed insomnia, sleeping about 2 hours a night, reviewed sleep hygiene practices. Discussed use of benadryl. Prescribed oral spray ambien to use as needed. Discussed addictive nature of ambien.   Return in about 4 weeks (around 08/17/2018) for ROB and US.  Adelene Idlerhristanna Schuman MD [facility], [facility] 07/20/18 11:09 AM

## 2018-07-20 NOTE — Progress Notes (Signed)
ROB/Anatomy U/S C/o having trouble sleeping  IT'S a GIRL!

## 2018-08-21 ENCOUNTER — Ambulatory Visit (INDEPENDENT_AMBULATORY_CARE_PROVIDER_SITE_OTHER): Payer: Commercial Managed Care - PPO | Admitting: Obstetrics and Gynecology

## 2018-08-21 ENCOUNTER — Ambulatory Visit (INDEPENDENT_AMBULATORY_CARE_PROVIDER_SITE_OTHER): Payer: Commercial Managed Care - PPO

## 2018-08-21 ENCOUNTER — Encounter: Payer: Self-pay | Admitting: Obstetrics and Gynecology

## 2018-08-21 VITALS — BP 110/68 | Wt 202.0 lb

## 2018-08-21 DIAGNOSIS — Z362 Encounter for other antenatal screening follow-up: Secondary | ICD-10-CM

## 2018-08-21 DIAGNOSIS — O0990 Supervision of high risk pregnancy, unspecified, unspecified trimester: Secondary | ICD-10-CM

## 2018-08-21 DIAGNOSIS — Z3A24 24 weeks gestation of pregnancy: Secondary | ICD-10-CM

## 2018-08-21 NOTE — Progress Notes (Signed)
Routine Prenatal Care Visit  Subjective  Ariana Waters is a 32 y.o. [phone] at [redacted]w[redacted]d being seen today for ongoing prenatal care.  She is currently monitored for the following issues for this high-risk pregnancy and has Atypical squamous cell changes of undetermined significance (ASCUS) on cervical cytology with positive high risk human papilloma virus (HPV); History of abnormal cervical Pap smear; Hiatal hernia; LGSIL on Pap smear of cervix; Previous cesarean delivery, antepartum condition or complication; Obesity (BMI 30.0-34.9); Supervision of high risk pregnancy, antepartum; Weight loss; Obesity affecting pregnancy; and BMI 30.0-30.9,adult on their problem list.  ----------------------------------------------------------------------------------- Patient reports no complaints.   Contractions: Not present. Vag. Bleeding: None.  Movement: Absent. Denies leaking of fluid.  ----------------------------------------------------------------------------------- The following portions of the patient's history were reviewed and updated as appropriate: allergies, current medications, past family history, past medical history, past social history, past surgical history and problem list. Problem list updated.   Objective  Blood pressure 110/68, weight 202 lb (91.6 kg), last menstrual period 03/03/2018. Pregravid weight 176 lb (79.8 kg) Total Weight Gain 26 lb (11.8 kg) Urinalysis:      Fetal Status: Fetal Heart Rate (bpm): 152   Movement: Absent  Presentation: Vertex  General:  Alert, oriented and cooperative. Patient is in no acute distress.  Skin: Skin is warm and dry. No rash noted.   Cardiovascular: Normal heart rate noted  Respiratory: Normal respiratory effort, no problems with respiration noted  Abdomen: Soft, gravid, appropriate for gestational age. Pain/Pressure: Absent     Pelvic:  Cervical exam deferred        Extremities: Normal range of motion.     ental Status: Normal mood and  affect. Normal behavior. Normal judgment and thought content.    Assessment   32 y.o. [id number] at [redacted]w[redacted]d by  12/08/2018, by Last Menstrual Period presenting for routine prenatal visit  Plan   pregnancy #2 Problems (from 04/21/18 to present)    Problem Noted Resolved   Obesity affecting pregnancy 07/01/2018 by Conard Novak, MD No   BMI 30.0-30.9,adult 07/01/2018 by Conard Novak, MD No   Supervision of high risk pregnancy, antepartum 04/24/2018 by Farrel Conners, CNM No   Overview Addendum 07/20/2018 11:09 AM by Natale Milch, MD    Clinic [facility] Prenatal Labs  Dating  LMP=7wk [place] Blood type: A/Positive/-- (05/28 1045)   Genetic Screen 1 Screen:  normal Antibody:Negative (05/28 1045)  Anatomic [place]  incomplete Rubella: 1.15 (05/28 1045)  Varicella: VI  GTT Early: 97 Third trimester:  RPR: Non Reactive (05/28 1045)   Rhogam Not applicable HBsAg: Negative (05/28 1045) neg  TDaP vaccine                        Flu Shot: HIV: Non Reactive (05/28 1045)   Baby Food                                GBS:   Contraception  Pap: 2019 LSIL HPV +  CBB   AA hemoglobin  CS/VBAC  cesarean   Support Person           Previous cesarean delivery, antepartum condition or complication 04/21/2018 by Farrel Conners, CNM No   Obesity (BMI 30.0-34.9) 04/21/2018 by Farrel Conners, CNM No       Gestational age appropriate obstetric precautions including but not limited to vaginal bleeding, contractions, leaking of fluid and fetal movement  were reviewed in detail with the patient.    Given information on birthcontrol options postpartum. Recommended bedsider.org. She is considering the patch, she was gvien two placebo patches to try.  Discussed breast feeding. Patient is planning on bottle feeding.   Return in about 4 weeks (around 09/18/2018) for 1GTT and ROB.  Adelene Idler MD [facility], [facility] 08/21/18 10:14 AM

## 2018-08-21 NOTE — Progress Notes (Signed)
ROB/US No concerns   

## 2018-09-04 ENCOUNTER — Encounter: Payer: Self-pay | Admitting: Obstetrics and Gynecology

## 2018-09-04 ENCOUNTER — Ambulatory Visit (INDEPENDENT_AMBULATORY_CARE_PROVIDER_SITE_OTHER): Payer: Commercial Managed Care - PPO | Admitting: Obstetrics and Gynecology

## 2018-09-04 VITALS — BP 114/72 | Wt 205.0 lb

## 2018-09-04 DIAGNOSIS — O23592 Infection of other part of genital tract in pregnancy, second trimester: Secondary | ICD-10-CM

## 2018-09-04 DIAGNOSIS — N760 Acute vaginitis: Secondary | ICD-10-CM

## 2018-09-04 DIAGNOSIS — R300 Dysuria: Secondary | ICD-10-CM

## 2018-09-04 LAB — POCT URINALYSIS DIPSTICK
BILIRUBIN UA: NEGATIVE
Glucose, UA: NEGATIVE
Nitrite, UA: NEGATIVE
PH UA: 6 (ref 5.0–8.0)
Protein, UA: NEGATIVE
SPEC GRAV UA: 1.015 (ref 1.010–1.025)
UROBILINOGEN UA: 0.2 U/dL

## 2018-09-04 LAB — OB RESULTS CONSOLE GC/CHLAMYDIA
Chlamydia: NEGATIVE
Gonorrhea: NEGATIVE

## 2018-09-04 NOTE — Progress Notes (Signed)
Ob problem visit C/o Spotting starting this morning, only when she wipes

## 2018-09-04 NOTE — Telephone Encounter (Signed)
Will have patient come in for an exam. Thank you, Dr. Jerene Pitch

## 2018-09-06 LAB — URINE CULTURE

## 2018-09-07 LAB — NUSWAB VAGINITIS PLUS (VG+)
CANDIDA ALBICANS, NAA: NEGATIVE
CANDIDA GLABRATA, NAA: NEGATIVE
Chlamydia trachomatis, NAA: NEGATIVE
Neisseria gonorrhoeae, NAA: NEGATIVE
Trich vag by NAA: POSITIVE — AB

## 2018-09-08 ENCOUNTER — Other Ambulatory Visit: Payer: Self-pay | Admitting: Obstetrics and Gynecology

## 2018-09-08 DIAGNOSIS — A599 Trichomoniasis, unspecified: Secondary | ICD-10-CM

## 2018-09-08 MED ORDER — METRONIDAZOLE 500 MG PO TABS: 2000.0000 mg | ORAL_TABLET | Freq: Once | ORAL | 0 refills | Status: AC

## 2018-09-08 NOTE — Telephone Encounter (Signed)
Called and discussed results with patient.

## 2018-09-08 NOTE — Progress Notes (Signed)
Trich +, discussed on phone, prescription sent.

## 2018-09-18 ENCOUNTER — Ambulatory Visit (INDEPENDENT_AMBULATORY_CARE_PROVIDER_SITE_OTHER): Payer: Commercial Managed Care - PPO | Admitting: Maternal Newborn

## 2018-09-18 ENCOUNTER — Other Ambulatory Visit: Payer: Commercial Managed Care - PPO

## 2018-09-18 ENCOUNTER — Encounter: Payer: Self-pay | Admitting: Maternal Newborn

## 2018-09-18 VITALS — BP 100/60 | Wt 205.0 lb

## 2018-09-18 DIAGNOSIS — O0990 Supervision of high risk pregnancy, unspecified, unspecified trimester: Secondary | ICD-10-CM

## 2018-09-18 DIAGNOSIS — Z3A28 28 weeks gestation of pregnancy: Secondary | ICD-10-CM

## 2018-09-18 NOTE — Progress Notes (Signed)
Routine Prenatal Care Visit  Subjective  Ariana Waters is a 32 y.o. [phone] at [redacted]w[redacted]d being seen today for ongoing prenatal care.  She is currently monitored for the following issues for this high-risk pregnancy and has Atypical squamous cell changes of undetermined significance (ASCUS) on cervical cytology with positive high risk human papilloma virus (HPV); History of abnormal cervical Pap smear; Hiatal hernia; LGSIL on Pap smear of cervix; Previous cesarean delivery, antepartum condition or complication; Obesity (BMI 30.0-34.9); Supervision of high risk pregnancy, antepartum; Weight loss; Obesity affecting pregnancy; and BMI 30.0-30.9,adult on their problem list.  ----------------------------------------------------------------------------------- Patient reports no complaints.   Contractions: Not present. Vag. Bleeding: None.  Movement: Present. No leaking of fluid.  ----------------------------------------------------------------------------------- The following portions of the patient's history were reviewed and updated as appropriate: allergies, current medications, past family history, past medical history, past social history, past surgical history and problem list. Problem list updated.  Objective  Blood pressure 100/60, weight 205 lb (93 kg), last menstrual period 03/03/2018. Pregravid weight 176 lb (79.8 kg) Total Weight Gain 29 lb (13.2 kg) Body mass index is 35.19 kg/m.  Urinalysis: Protein Negative, Glucose Negative  Fetal Status: Fetal Heart Rate (bpm): 145 Fundal Height: 29 cm Movement: Present     General:  Alert, oriented and cooperative. Patient is in no acute distress.  Skin: Skin is warm and dry. No rash noted.   Cardiovascular: Normal heart rate noted  Respiratory: Normal respiratory effort, no problems with respiration noted  Abdomen: Soft, gravid, appropriate for gestational age. Pain/Pressure: Absent     Pelvic:  Cervical exam deferred        Extremities:  Normal range of motion.  Edema: None  Mental Status: Normal mood and affect. Normal behavior. Normal judgment and thought content.     Assessment   32 y.o. [id number] at [redacted]w[redacted]d, EDD 12/08/2018 by Last Menstrual Period presenting for a routine prenatal visit.  Plan   pregnancy #2 Problems (from 04/21/18 to present)    Problem Noted Resolved   Obesity affecting pregnancy 07/01/2018 by Conard Novak, MD No   BMI 30.0-30.9,adult 07/01/2018 by Conard Novak, MD No   Supervision of high risk pregnancy, antepartum 04/24/2018 by Farrel Conners, CNM No   Overview Addendum 08/21/2018 10:15 AM by Natale Milch, MD    Clinic [facility] Prenatal Labs  Dating  LMP=7wk [place] Blood type: A/Positive/-- (05/28 1045)   Genetic Screen 1 Screen:  normal Antibody:Negative (05/28 1045)  Anatomic [place]  incomplete Rubella: 1.15 (05/28 1045)  Varicella: VI  GTT Early: 97 Third trimester:  RPR: Non Reactive (05/28 1045)   Rhogam Not applicable HBsAg: Negative (05/28 1045) neg  TDaP vaccine                        Flu Shot: HIV: Non Reactive (05/28 1045)   Baby Food      bottle                          GBS:   Contraception  given information Pap: 2019 LSIL HPV +  CBB   AA hemoglobin  CS/VBAC  cesarean   Support Person           Previous cesarean delivery, antepartum condition or complication 04/21/2018 by Farrel Conners, CNM No   Obesity (BMI 30.0-34.9) 04/21/2018 by Farrel Conners, CNM No      GTT/28 week labs today.   Gestational age appropriate  obstetric precautions were reviewed.  Please refer to After Visit Summary for other counseling recommendations.   Return in about 2 weeks (around 10/02/2018) for ROB.  Marcelyn Bruins, CNM 09/18/2018  4:52 PM

## 2018-09-18 NOTE — Progress Notes (Signed)
ROB and 1 hr GTT- no concerns 

## 2018-09-18 NOTE — Patient Instructions (Signed)
Third Trimester of Pregnancy The third trimester is from week 28 through week 40 (months 7 through 9). The third trimester is a time when the unborn baby (fetus) is growing rapidly. At the end of the ninth month, the fetus is about 20 inches in length and weighs 6-10 pounds. Body changes during your third trimester Your body will continue to go through many changes during pregnancy. The changes vary from woman to woman. During the third trimester:  Your weight will continue to increase. You can expect to gain 25-35 pounds (11-16 kg) by the end of the pregnancy.  You may begin to get stretch marks on your hips, abdomen, and breasts.  You may urinate more often because the fetus is moving lower into your pelvis and pressing on your bladder.  You may develop or continue to have heartburn. This is caused by increased hormones that slow down muscles in the digestive tract.  You may develop or continue to have constipation because increased hormones slow digestion and cause the muscles that push waste through your intestines to relax.  You may develop hemorrhoids. These are swollen veins (varicose veins) in the rectum that can itch or be painful.  You may develop swollen, bulging veins (varicose veins) in your legs.  You may have increased body aches in the pelvis, back, or thighs. This is due to weight gain and increased hormones that are relaxing your joints.  You may have changes in your hair. These can include thickening of your hair, rapid growth, and changes in texture. Some women also have hair loss during or after pregnancy, or hair that feels dry or thin. Your hair will most likely return to normal after your baby is born.  Your breasts will continue to grow and they will continue to become tender. A yellow fluid (colostrum) may leak from your breasts. This is the first milk you are producing for your baby.  Your belly button may stick out.  You may notice more swelling in your hands,  face, or ankles.  You may have increased tingling or numbness in your hands, arms, and legs. The skin on your belly may also feel numb.  You may feel short of breath because of your expanding uterus.  You may have more problems sleeping. This can be caused by the size of your belly, increased need to urinate, and an increase in your body's metabolism.  You may notice the fetus "dropping," or moving lower in your abdomen (lightening).  You may have increased vaginal discharge.  You may notice your joints feel loose and you may have pain around your pelvic bone.  What to expect at prenatal visits You will have prenatal exams every 2 weeks until week 36. Then you will have weekly prenatal exams. During a routine prenatal visit:  You will be weighed to make sure you and the baby are growing normally.  Your blood pressure will be taken.  Your abdomen will be measured to track your baby's growth.  The fetal heartbeat will be listened to.  Any test results from the previous visit will be discussed.  You may have a cervical check near your due date to see if your cervix has softened or thinned (effaced).  You will be tested for Group B streptococcus. This happens between 35 and 37 weeks.  Your health care provider may ask you:  What your birth plan is.  How you are feeling.  If you are feeling the baby move.  If you have had   any abnormal symptoms, such as leaking fluid, bleeding, severe headaches, or abdominal cramping.  If you are using any tobacco products, including cigarettes, chewing tobacco, and electronic cigarettes.  If you have any questions.  Other tests or screenings that may be performed during your third trimester include:  Blood tests that check for low iron levels (anemia).  Fetal testing to check the health, activity level, and growth of the fetus. Testing is done if you have certain medical conditions or if there are problems during the  pregnancy.  Nonstress test (NST). This test checks the health of your baby to make sure there are no signs of problems, such as the baby not getting enough oxygen. During this test, a belt is placed around your belly. The baby is made to move, and its heart rate is monitored during movement.  What is false labor? False labor is a condition in which you feel small, irregular tightenings of the muscles in the womb (contractions) that usually go away with rest, changing position, or drinking water. These are called Braxton Hicks contractions. Contractions may last for hours, days, or even weeks before true labor sets in. If contractions come at regular intervals, become more frequent, increase in intensity, or become painful, you should see your health care provider. What are the signs of labor?  Abdominal cramps.  Regular contractions that start at 10 minutes apart and become stronger and more frequent with time.  Contractions that start on the top of the uterus and spread down to the lower abdomen and back.  Increased pelvic pressure and dull back pain.  A watery or bloody mucus discharge that comes from the vagina.  Leaking of amniotic fluid. This is also known as your "water breaking." It could be a slow trickle or a gush. Let your health care provider know if it has a color or strange odor. If you have any of these signs, call your health care provider right away, even if it is before your due date. Follow these instructions at home: Medicines  Follow your health care provider's instructions regarding medicine use. Specific medicines may be either safe or unsafe to take during pregnancy.  Take a prenatal vitamin that contains at least 600 micrograms (mcg) of folic acid.  If you develop constipation, try taking a stool softener if your health care provider approves. Eating and drinking  Eat a balanced diet that includes fresh fruits and vegetables, whole grains, good sources of protein  such as meat, eggs, or tofu, and low-fat dairy. Your health care provider will help you determine the amount of weight gain that is right for you.  Avoid raw meat and uncooked cheese. These carry germs that can cause birth defects in the baby.  If you have low calcium intake from food, talk to your health care provider about whether you should take a daily calcium supplement.  Eat four or five small meals rather than three large meals a day.  Limit foods that are high in fat and processed sugars, such as fried and sweet foods.  To prevent constipation: ? Drink enough fluid to keep your urine clear or pale yellow. ? Eat foods that are high in fiber, such as fresh fruits and vegetables, whole grains, and beans. Activity  Exercise only as directed by your health care provider. Most women can continue their usual exercise routine during pregnancy. Try to exercise for 30 minutes at least 5 days a week. Stop exercising if you experience uterine contractions.  Avoid heavy   lifting.  Do not exercise in extreme heat or humidity, or at high altitudes.  Wear low-heel, comfortable shoes.  Practice good posture.  You may continue to have sex unless your health care provider tells you otherwise. Relieving pain and discomfort  Take frequent breaks and rest with your legs elevated if you have leg cramps or low back pain.  Take warm sitz baths to soothe any pain or discomfort caused by hemorrhoids. Use hemorrhoid cream if your health care provider approves.  Wear a good support bra to prevent discomfort from breast tenderness.  If you develop varicose veins: ? Wear support pantyhose or compression stockings as told by your healthcare provider. ? Elevate your feet for 15 minutes, 3-4 times a day. Prenatal care  Write down your questions. Take them to your prenatal visits.  Keep all your prenatal visits as told by your health care provider. This is important. Safety  Wear your seat belt at  all times when driving.  Make a list of emergency phone numbers, including numbers for family, friends, the hospital, and police and fire departments. General instructions  Avoid cat litter boxes and soil used by cats. These carry germs that can cause birth defects in the baby. If you have a cat, ask someone to clean the litter box for you.  Do not travel far distances unless it is absolutely necessary and only with the approval of your health care provider.  Do not use hot tubs, steam rooms, or saunas.  Do not drink alcohol.  Do not use any products that contain nicotine or tobacco, such as cigarettes and e-cigarettes. If you need help quitting, ask your health care provider.  Do not use any medicinal herbs or unprescribed drugs. These chemicals affect the formation and growth of the baby.  Do not douche or use tampons or scented sanitary pads.  Do not cross your legs for long periods of time.  To prepare for the arrival of your baby: ? Take prenatal classes to understand, practice, and ask questions about labor and delivery. ? Make a trial run to the hospital. ? Visit the hospital and tour the maternity area. ? Arrange for maternity or paternity leave through employers. ? Arrange for family and friends to take care of pets while you are in the hospital. ? Purchase a rear-facing car seat and make sure you know how to install it in your car. ? Pack your hospital bag. ? Prepare the baby's nursery. Make sure to remove all pillows and stuffed animals from the baby's crib to prevent suffocation.  Visit your dentist if you have not gone during your pregnancy. Use a soft toothbrush to brush your teeth and be gentle when you floss. Contact a health care provider if:  You are unsure if you are in labor or if your water has broken.  You become dizzy.  You have mild pelvic cramps, pelvic pressure, or nagging pain in your abdominal area.  You have lower back pain.  You have persistent  nausea, vomiting, or diarrhea.  You have an unusual or bad smelling vaginal discharge.  You have pain when you urinate. Get help right away if:  Your water breaks before 37 weeks.  You have regular contractions less than 5 minutes apart before 37 weeks.  You have a fever.  You are leaking fluid from your vagina.  You have spotting or bleeding from your vagina.  You have severe abdominal pain or cramping.  You have rapid weight loss or weight gain.    You have shortness of breath with chest pain.  You notice sudden or extreme swelling of your face, hands, ankles, feet, or legs.  Your baby makes fewer than 10 movements in 2 hours.  You have severe headaches that do not go away when you take medicine.  You have vision changes. Summary  The third trimester is from week 28 through week 40, months 7 through 9. The third trimester is a time when the unborn baby (fetus) is growing rapidly.  During the third trimester, your discomfort may increase as you and your baby continue to gain weight. You may have abdominal, leg, and back pain, sleeping problems, and an increased need to urinate.  During the third trimester your breasts will keep growing and they will continue to become tender. A yellow fluid (colostrum) may leak from your breasts. This is the first milk you are producing for your baby.  False labor is a condition in which you feel small, irregular tightenings of the muscles in the womb (contractions) that eventually go away. These are called Braxton Hicks contractions. Contractions may last for hours, days, or even weeks before true labor sets in.  Signs of labor can include: abdominal cramps; regular contractions that start at 10 minutes apart and become stronger and more frequent with time; watery or bloody mucus discharge that comes from the vagina; increased pelvic pressure and dull back pain; and leaking of amniotic fluid. This information is not intended to replace advice  given to you by your health care provider. Make sure you discuss any questions you have with your health care provider. Document Released: 11/05/2001 Document Revised: 04/18/2016 Document Reviewed: 01/12/2013 Elsevier Interactive Patient Education  2017 [employer] Inc[employer]  

## 2018-09-19 LAB — 28 WEEK RH+PANEL
BASOS ABS: 0 10*3/uL (ref 0.0–0.2)
Basos: 0 %
EOS (ABSOLUTE): 0.1 10*3/uL (ref 0.0–0.4)
EOS: 1 %
Gestational Diabetes Screen: 123 mg/dL (ref 65–139)
HIV SCREEN 4TH GENERATION: NONREACTIVE
Hematocrit: 28.8 % — ABNORMAL LOW (ref 34.0–46.6)
Hemoglobin: 9.1 g/dL — ABNORMAL LOW (ref 11.1–15.9)
IMMATURE GRANS (ABS): 0 10*3/uL (ref 0.0–0.1)
Immature Granulocytes: 0 %
LYMPHS: 17 %
Lymphocytes Absolute: 1.5 10*3/uL (ref 0.7–3.1)
MCH: 27.2 pg (ref 26.6–33.0)
MCHC: 31.6 g/dL (ref 31.5–35.7)
MCV: 86 fL (ref 79–97)
MONOCYTES: 4 %
Monocytes Absolute: 0.4 10*3/uL (ref 0.1–0.9)
NEUTROS ABS: 6.9 10*3/uL (ref 1.4–7.0)
Neutrophils: 78 %
PLATELETS: 244 10*3/uL (ref 150–450)
RBC: 3.35 x10E6/uL — ABNORMAL LOW (ref 3.77–5.28)
RDW: 13.1 % (ref 12.3–15.4)
RPR Ser Ql: NONREACTIVE
WBC: 9 10*3/uL (ref 3.4–10.8)

## 2018-10-02 ENCOUNTER — Encounter: Payer: Self-pay | Admitting: Maternal Newborn

## 2018-10-02 ENCOUNTER — Ambulatory Visit (INDEPENDENT_AMBULATORY_CARE_PROVIDER_SITE_OTHER): Payer: Commercial Managed Care - PPO | Admitting: Maternal Newborn

## 2018-10-02 VITALS — BP 116/60 | Wt 207.0 lb

## 2018-10-02 DIAGNOSIS — Z23 Encounter for immunization: Secondary | ICD-10-CM

## 2018-10-02 DIAGNOSIS — O99013 Anemia complicating pregnancy, third trimester: Secondary | ICD-10-CM

## 2018-10-02 DIAGNOSIS — O0990 Supervision of high risk pregnancy, unspecified, unspecified trimester: Secondary | ICD-10-CM

## 2018-10-02 DIAGNOSIS — Z3A30 30 weeks gestation of pregnancy: Secondary | ICD-10-CM

## 2018-10-02 LAB — POCT URINALYSIS DIPSTICK OB
GLUCOSE, UA: NEGATIVE
PROTEIN: NEGATIVE

## 2018-10-02 MED ORDER — FERROUS SULFATE 325 (65 FE) MG PO TABS: 325.0000 mg | ORAL_TABLET | Freq: Two times a day (BID) | ORAL | 1 refills | Status: DC

## 2018-10-02 NOTE — Progress Notes (Signed)
Routine Prenatal Care Visit  Subjective  Ariana Waters is a 32 y.o. [phone] at [redacted]w[redacted]d being seen today for ongoing prenatal care.  She is currently monitored for the following issues for this high-risk pregnancy and has Atypical squamous cell changes of undetermined significance (ASCUS) on cervical cytology with positive high risk human papilloma virus (HPV); History of abnormal cervical Pap smear; Hiatal hernia; LGSIL on Pap smear of cervix; Previous cesarean delivery, antepartum condition or complication; Obesity (BMI 30.0-34.9); Supervision of high risk pregnancy, antepartum; Weight loss; Obesity affecting pregnancy; and BMI 30.0-30.9,adult on their problem list.  ----------------------------------------------------------------------------------- Patient reports no complaints.   Contractions: Not present. Vag. Bleeding: None.  Movement: Present. No leaking of fluid.  ----------------------------------------------------------------------------------- The following portions of the patient's history were reviewed and updated as appropriate: allergies, current medications, past family history, past medical history, past social history, past surgical history and problem list. Problem list updated.  Objective  Blood pressure 116/60, weight 207 lb (93.9 kg), last menstrual period 03/03/2018. Pregravid weight 176 lb (79.8 kg) Total Weight Gain 31 lb (14.1 kg)   Urinalysis: Protein Negative, Glucose Negative  Fetal Status: Fetal Heart Rate (bpm): 146 Fundal Height: 29 cm Movement: Present     General:  Alert, oriented and cooperative. Patient is in no acute distress.  Skin: Skin is warm and dry. No rash noted.   Cardiovascular: Normal heart rate noted  Respiratory: Normal respiratory effort, no problems with respiration noted  Abdomen: Soft, gravid, appropriate for gestational age. Pain/Pressure: Absent     Pelvic:  Cervical exam deferred        Extremities: Normal range of motion.   Edema: None  Mental Status: Normal mood and affect. Normal behavior. Normal judgment and thought content.     Assessment   32 y.o. [id number] at [redacted]w[redacted]d, EDD 12/08/2018 by Last Menstrual Period presenting for a routine prenatal visit.  Plan   pregnancy #2 Problems (from 04/21/18 to present)    Problem Noted Resolved   Obesity affecting pregnancy 07/01/2018 by Conard Novak, MD No   BMI 30.0-30.9,adult 07/01/2018 by Conard Novak, MD No   Supervision of high risk pregnancy, antepartum 04/24/2018 by Farrel Conners, CNM No   Overview Addendum 08/21/2018 10:15 AM by Natale Milch, MD    Clinic [facility] Prenatal Labs  Dating  LMP=7wk [place] Blood type: A/Positive/-- (05/28 1045)   Genetic Screen 1 Screen:  normal Antibody:Negative (05/28 1045)  Anatomic [place]  incomplete Rubella: 1.15 (05/28 1045)  Varicella: VI  GTT Early: 97 Third trimester:  RPR: Non Reactive (05/28 1045)   Rhogam Not applicable HBsAg: Negative (05/28 1045) neg  TDaP vaccine                        Flu Shot: HIV: Non Reactive (05/28 1045)   Baby Food      bottle                          GBS:   Contraception  given information Pap: 2019 LSIL HPV +  CBB   AA hemoglobin  CS/VBAC  cesarean   Support Person           Previous cesarean delivery, antepartum condition or complication 04/21/2018 by Farrel Conners, CNM No   Obesity (BMI 30.0-34.9) 04/21/2018 by Farrel Conners, CNM No    Discussed iron supplementation for anemia. Long conversation about vaginal delivery; needs to have delivery planning conversation  with OB provider.   Gestational age appropriate obstetric precautions were reviewed.  Return in about 2 weeks (around 10/16/2018) for ROB.  Marcelyn Bruins, CNM 10/02/2018  3:16 PM

## 2018-10-02 NOTE — Progress Notes (Signed)
ROB, TDap and BT form today- no concerns

## 2018-10-16 ENCOUNTER — Ambulatory Visit (INDEPENDENT_AMBULATORY_CARE_PROVIDER_SITE_OTHER): Payer: Commercial Managed Care - PPO | Admitting: Certified Nurse Midwife

## 2018-10-16 VITALS — BP 106/68 | Wt 210.0 lb

## 2018-10-16 DIAGNOSIS — Z3A32 32 weeks gestation of pregnancy: Secondary | ICD-10-CM

## 2018-10-16 DIAGNOSIS — O0990 Supervision of high risk pregnancy, unspecified, unspecified trimester: Secondary | ICD-10-CM

## 2018-10-16 DIAGNOSIS — D509 Iron deficiency anemia, unspecified: Secondary | ICD-10-CM

## 2018-10-16 DIAGNOSIS — O99013 Anemia complicating pregnancy, third trimester: Secondary | ICD-10-CM

## 2018-10-16 DIAGNOSIS — O34219 Maternal care for unspecified type scar from previous cesarean delivery: Secondary | ICD-10-CM

## 2018-10-16 LAB — POCT URINALYSIS DIPSTICK OB
GLUCOSE, UA: NEGATIVE
POC,PROTEIN,UA: NEGATIVE

## 2018-10-16 LAB — POCT HEMOGLOBIN: HEMOGLOBIN: 9.5 g/dL (ref 9.5–13.5)

## 2018-10-16 NOTE — Progress Notes (Signed)
ROB HGB 9.5 today

## 2018-10-17 NOTE — Progress Notes (Signed)
ROB at 32wk3d: Prior CS for breech presentation. Has decided she wanted repeat CS. Advised has a 75% chance of delivering vaginally with spontaneous labor. Has also had an abdominoplasty. Scheduled for repeat CS on 1/7 with Dr Jerene PitchSchuman.  Baby active. Taking iron for anemia. Fingerstick hemoglobin 9.5gm/dl. Answered questions regarding contraception: Interested in [place] I[place] ROB in 2 weeks with Dr Jerene PitchSchuman to further discuss repeat CS.  Farrel Connersolleen Kyion Gautier, CNM

## 2018-10-19 ENCOUNTER — Telehealth: Payer: Self-pay | Admitting: Obstetrics and Gynecology

## 2018-10-19 NOTE — Telephone Encounter (Signed)
-----   Message from Farrel Connersolleen Gutierrez, [place] sent at 10/17/2018  9:53 AM EST ----- Regarding: scheduling surgery Surgery Booking Request Patient Full Name:  Ariana Waters  MRN: [id number]  DOB: Mar 18, 1986  Surgeon: Dr Jerene PitchSchuman Requested Surgery Date and Time: 12/01/2018 Primary Diagnosis AND Code: Prior Cesarean section Secondary Diagnosis and Code: Hx of abdominoplasty Surgical Procedure: Cesarean Section L&D Notification: Yes Admission Status: surgery admit Length of Surgery: 1.5 hours Special Case Needs: unsure H&P: pending Phone Interview: no Interpreter: no Language: English Medical Clearance: no Special Scheduling Instructions: no

## 2018-10-19 NOTE — Telephone Encounter (Signed)
[facility] @ 9:10am w/ Dr Jerene PitchSchuman, Pre-admit Testing afterwards, and OR on 12/01/18.

## 2018-10-28 ENCOUNTER — Telehealth: Payer: Self-pay

## 2018-10-28 NOTE — Telephone Encounter (Signed)
Pt is 34wks; had B-H all day Fri with diarrhea x24hrs; stomach hurts today but is unable to go to the bathroom; also has like body aches today.  Has appt Fri. [phone]  Explained diff between true ctxs and B-H ctxs.  She was having painful B-H.  Adv she is going to be having true ctxs but we don't want her to have more than four an hour.  Adv to let her system rest and just sip clear liquids x24hrs; then bland food for 24hrs then work up to reg diet.  To keep appt Fri.  Pt aware if doesn't keep fluids down for 24hrs or things get worse to be seen.  Also adv can take e.s. Tylenol for body aches; immidium, kaopectate, for donnagel PG for diarrhea.

## 2018-10-30 ENCOUNTER — Ambulatory Visit (INDEPENDENT_AMBULATORY_CARE_PROVIDER_SITE_OTHER): Payer: Commercial Managed Care - PPO | Admitting: Obstetrics and Gynecology

## 2018-10-30 ENCOUNTER — Encounter: Payer: Self-pay | Admitting: Obstetrics and Gynecology

## 2018-10-30 VITALS — BP 110/64 | Wt 209.0 lb

## 2018-10-30 DIAGNOSIS — O99013 Anemia complicating pregnancy, third trimester: Secondary | ICD-10-CM

## 2018-10-30 DIAGNOSIS — O0990 Supervision of high risk pregnancy, unspecified, unspecified trimester: Secondary | ICD-10-CM

## 2018-10-30 DIAGNOSIS — Z3A34 34 weeks gestation of pregnancy: Secondary | ICD-10-CM | POA: Diagnosis not present

## 2018-10-30 DIAGNOSIS — O34219 Maternal care for unspecified type scar from previous cesarean delivery: Secondary | ICD-10-CM

## 2018-10-30 DIAGNOSIS — Z98891 History of uterine scar from previous surgery: Secondary | ICD-10-CM | POA: Insufficient documentation

## 2018-10-30 DIAGNOSIS — Z8759 Personal history of other complications of pregnancy, childbirth and the puerperium: Secondary | ICD-10-CM

## 2018-10-30 LAB — POCT URINALYSIS DIPSTICK OB: GLUCOSE, UA: NEGATIVE

## 2018-10-30 NOTE — Progress Notes (Signed)
ROB No concerns 

## 2018-10-30 NOTE — Progress Notes (Signed)
Routine Prenatal Care Visit  Delayed chart closure.  Subjective  Ariana Waters is a 32 y.o. [id number] at 4783w0d being seen today for ongoing prenatal care.  She is currently monitored for the following issues for this low-risk pregnancy and has Atypical squamous cell changes of undetermined significance (ASCUS) on cervical cytology with positive high risk human papilloma virus (HPV); History of abnormal cervical Pap smear; Hiatal hernia; LGSIL on Pap smear of cervix; Hx of abdominoplasty; Obesity (BMI 30.0-34.9); Supervision of high risk pregnancy, antepartum; Weight loss; Obesity affecting pregnancy; BMI 30.0-30.9,adult; Status post cesarean delivery; and [redacted] weeks gestation of pregnancy on their problem list.  ----------------------------------------------------------------------------------- Patient reports no complaints.   Reports normal fetal movement. Denies contractions. Denies vaginal bleeding.  Denies leaking of fluid.  ----------------------------------------------------------------------------------- The following portions of the patient's history were reviewed and updated as appropriate: allergies, current medications, past family history, past medical history, past social history, past surgical history and problem list. Problem list updated.   Objective  Blood pressure 110/64, weight 209 lb (94.8 kg), last menstrual period 03/03/2018, not currently breastfeeding. Pregravid weight 176 lb (79.8 kg) Total Weight Gain 33 lb (15 kg) Urinalysis:      Fetal Status: heart tones present  General:  Alert, oriented and cooperative. Patient is in no acute distress.  Skin: Skin is warm and dry. No rash noted.   Cardiovascular: Normal heart rate noted  Respiratory: Normal respiratory effort, no problems with respiration noted  Abdomen: Soft, gravid, appropriate for gestational age. Pain/Pressure: Absent     Pelvic:  Cervical exam deferred        Extremities: Normal range of motion.      Mental Status: Normal mood and affect. Normal behavior. Normal judgment and thought content.     Assessment   32 y.o. [id number] at 4483w0d by  12/08/2018, by Last Menstrual Period presenting for routine prenatal visit  Plan   pregnancy #2 Problems (from 04/21/18 to 12/03/18)    Problem Noted Resolved   Status post cesarean delivery 10/30/2018 by Natale MilchSchuman,  R, MD No   Obesity affecting pregnancy 07/01/2018 by Conard NovakJackson, Stephen D, MD No   BMI 30.0-30.9,adult 07/01/2018 by Conard NovakJackson, Stephen D, MD No   Supervision of high risk pregnancy, antepartum 04/24/2018 by Farrel ConnersGutierrez, Colleen, CNM No   Overview Addendum 11/19/2018  3:11 PM by Farrel ConnersGutierrez, Colleen, CNM    [facility]inic [facility] Prenatal Labs  Dating  LMP=7wk US Blood type: A/Positive/-- (05/28 1045)   Genetic Screen 1 Screen:  normal Antibody:Negative (05/28 1045)  Anatomic US  incomplete Rubella: 1.15 (05/28 1045)  Varicella: VI  GTT Early: 97 Third trimester:  RPR: Non Reactive (05/28 1045)   Rhogam Not applicable HBsAg: Negative (05/28 1045) neg  TDaP vaccine      10/02/18                  Flu Shot: HIV: Non Reactive (05/28 1045)   Baby Food      bottle                          GBS: negative  Contraception  Liletta Pap: 2019 LSIL HPV +  CBB   AA hemoglobin  CS/VBAC  cesarean   Support Person           Previous Version   Hx of abdominoplasty 04/21/2018 by Farrel ConnersGutierrez, Colleen, CNM No   Obesity (BMI 30.0-34.9) 04/21/2018 by Farrel ConnersGutierrez, Colleen, CNM No  Gestational age appropriate obstetric precautions including but not limited to vaginal bleeding, contractions, leaking of fluid and fetal movement were reviewed in detail with the patient.    Return in about 2 weeks (around 11/13/2018) for ROB and [place].  Natale Milch MD [facility], [facility] 01/29/2021, 9:13 AM     Bedside US shows baby in the cephalic position. Hx of IUGR with last pregnancy, will obtain growth [place] next visit. Fundal heights normal.   Patient desires repeat cesarean section. Discussed risks and benefits of attempting VBAC.  She will let us know if she changes her mind.  She   Return in about 2 weeks (around 11/13/2018) for ROB and [place].  Natale Milch MD [facility], [facility] 10/30/2018, 12:47 PM

## 2018-10-31 LAB — FERRITIN: Ferritin: 19 ng/mL (ref 15–150)

## 2018-10-31 LAB — IRON AND TIBC
Iron Saturation: 45 % (ref 15–55)
Iron: 162 ug/dL — ABNORMAL HIGH (ref 27–159)
Total Iron Binding Capacity: 360 ug/dL (ref 250–450)
UIBC: 198 ug/dL (ref 131–425)

## 2018-10-31 LAB — CBC
Hematocrit: 33.4 % — ABNORMAL LOW (ref 34.0–46.6)
Hemoglobin: 10.9 g/dL — ABNORMAL LOW (ref 11.1–15.9)
MCH: 28.5 pg (ref 26.6–33.0)
MCHC: 32.6 g/dL (ref 31.5–35.7)
MCV: 87 fL (ref 79–97)
Platelets: 190 10*3/uL (ref 150–450)
RBC: 3.83 x10E6/uL (ref 3.77–5.28)
RDW: 17.2 % — ABNORMAL HIGH (ref 12.3–15.4)
WBC: 7.9 10*3/uL (ref 3.4–10.8)

## 2018-10-31 LAB — FOLATE: Folate: >20 ng/mL (ref >3.0)

## 2018-10-31 LAB — VITAMIN B12: Vitamin B-12: 475 pg/mL (ref 232–1245)

## 2018-11-02 NOTE — Progress Notes (Signed)
Anemia improving, continue to take oral iron, released to mychart.

## 2018-11-10 ENCOUNTER — Telehealth: Payer: Self-pay

## 2018-11-10 NOTE — Telephone Encounter (Signed)
FMLA/DISABILITY form for [employer] filled out, signature obtained, and given to [place] for processing. 

## 2018-11-13 ENCOUNTER — Ambulatory Visit (INDEPENDENT_AMBULATORY_CARE_PROVIDER_SITE_OTHER): Payer: Commercial Managed Care - PPO

## 2018-11-13 ENCOUNTER — Ambulatory Visit (INDEPENDENT_AMBULATORY_CARE_PROVIDER_SITE_OTHER): Payer: Commercial Managed Care - PPO | Admitting: Certified Nurse Midwife

## 2018-11-13 VITALS — BP 120/80 | Wt 213.0 lb

## 2018-11-13 DIAGNOSIS — Z3689 Encounter for other specified antenatal screening: Secondary | ICD-10-CM | POA: Diagnosis not present

## 2018-11-13 DIAGNOSIS — O0990 Supervision of high risk pregnancy, unspecified, unspecified trimester: Secondary | ICD-10-CM

## 2018-11-13 DIAGNOSIS — Z8759 Personal history of other complications of pregnancy, childbirth and the puerperium: Secondary | ICD-10-CM

## 2018-11-13 DIAGNOSIS — Z3A34 34 weeks gestation of pregnancy: Secondary | ICD-10-CM

## 2018-11-13 DIAGNOSIS — Z3685 Encounter for antenatal screening for Streptococcus B: Secondary | ICD-10-CM

## 2018-11-13 DIAGNOSIS — O34219 Maternal care for unspecified type scar from previous cesarean delivery: Secondary | ICD-10-CM

## 2018-11-13 DIAGNOSIS — Z3A36 36 weeks gestation of pregnancy: Secondary | ICD-10-CM

## 2018-11-13 LAB — POCT URINALYSIS DIPSTICK OB
Glucose, UA: NEGATIVE
POC,PROTEIN,UA: NEGATIVE

## 2018-11-13 NOTE — Progress Notes (Signed)
ROB  at 7536 wk3d: Doing well. Baby active. Some BH contractions. No LOF or vaginal bleeding. Baby active Growth scan today: 6#11oz (57th%)/ AFI 14.1 cm/ cephalic GBS done Labor precautions ROB 1 week.  Ariana Waters [name], CNM

## 2018-11-15 LAB — STREP GP B NAA: Strep Gp B NAA: NEGATIVE

## 2018-11-16 ENCOUNTER — Telehealth: Payer: Self-pay

## 2018-11-16 NOTE — Telephone Encounter (Signed)
Pt calling to see what to take for a cold.  [phone]  Adv plain sudafed, plain robitussin, e.s. tylenol, Hall's cough drops, sucrets, warm salt water gargles, sip hot liquids/soup, push fluids and rest.

## 2018-11-19 ENCOUNTER — Ambulatory Visit (INDEPENDENT_AMBULATORY_CARE_PROVIDER_SITE_OTHER): Payer: Commercial Managed Care - PPO | Admitting: Certified Nurse Midwife

## 2018-11-19 VITALS — BP 100/60 | Wt 213.5 lb

## 2018-11-19 DIAGNOSIS — O99013 Anemia complicating pregnancy, third trimester: Secondary | ICD-10-CM

## 2018-11-19 DIAGNOSIS — O0990 Supervision of high risk pregnancy, unspecified, unspecified trimester: Secondary | ICD-10-CM

## 2018-11-19 DIAGNOSIS — Z3A37 37 weeks gestation of pregnancy: Secondary | ICD-10-CM

## 2018-11-19 LAB — POCT URINALYSIS DIPSTICK OB
Glucose, UA: NEGATIVE
POC,PROTEIN,UA: NEGATIVE

## 2018-11-19 MED ORDER — FERROUS SULFATE 325 (65 FE) MG PO TABS: 325.0000 mg | ORAL_TABLET | Freq: Every day | ORAL | 1 refills | Status: DC

## 2018-11-19 NOTE — Progress Notes (Signed)
C/o cold sxs; grandmother passed; does she need to continue iron supp?  If so needs refill.rj

## 2018-11-20 ENCOUNTER — Encounter: Payer: Commercial Managed Care - PPO | Admitting: Obstetrics and Gynecology

## 2018-11-27 ENCOUNTER — Encounter: Payer: Commercial Managed Care - PPO | Admitting: Obstetrics and Gynecology

## 2018-11-27 ENCOUNTER — Ambulatory Visit (INDEPENDENT_AMBULATORY_CARE_PROVIDER_SITE_OTHER): Payer: Commercial Managed Care - PPO | Admitting: Certified Nurse Midwife

## 2018-11-27 VITALS — BP 106/68 | Wt 213.0 lb

## 2018-11-27 DIAGNOSIS — Z3A38 38 weeks gestation of pregnancy: Secondary | ICD-10-CM

## 2018-11-27 DIAGNOSIS — O34219 Maternal care for unspecified type scar from previous cesarean delivery: Secondary | ICD-10-CM

## 2018-11-27 DIAGNOSIS — O0990 Supervision of high risk pregnancy, unspecified, unspecified trimester: Secondary | ICD-10-CM

## 2018-11-27 LAB — POCT URINALYSIS DIPSTICK OB
Glucose, UA: NEGATIVE
POC,PROTEIN,UA: NEGATIVE

## 2018-11-27 NOTE — Progress Notes (Signed)
ROB

## 2018-11-27 NOTE — Progress Notes (Signed)
ROB at 38wk3d: Baby active. Some BH contractions. No vaginal bleeding Scheduled for CS on 1/7, preop 1/6 Cervix very posterior and long/ unable to reach os Labor precautions Farrel Conners, CNM

## 2018-11-29 NOTE — Progress Notes (Signed)
ROB at 37wk2d: Doing OK since PGM passed. Baby moving well. Has some pedal and ankle edema FHTs WNL. Hct on 12/6 was 33.4%. advised to decrease iron supplements to once/day. GBS negative ROB 1 week Labor precautions  Farrel Conners, CNM

## 2018-11-30 ENCOUNTER — Other Ambulatory Visit: Payer: Self-pay

## 2018-11-30 ENCOUNTER — Encounter
Admission: RE | Admit: 2018-11-30 | Discharge: 2018-11-30 | Disposition: A | Discharge status: Home / Self Care | Payer: Commercial Managed Care - PPO | Source: Ambulatory Visit | Attending: Obstetrics and Gynecology | Admitting: Obstetrics and Gynecology

## 2018-11-30 ENCOUNTER — Encounter: Payer: Self-pay | Admitting: Obstetrics and Gynecology

## 2018-11-30 ENCOUNTER — Ambulatory Visit (INDEPENDENT_AMBULATORY_CARE_PROVIDER_SITE_OTHER): Payer: Commercial Managed Care - PPO | Admitting: Obstetrics and Gynecology

## 2018-11-30 VITALS — BP 112/72 | HR 77 | Ht 64.0 in | Wt 219.0 lb

## 2018-11-30 DIAGNOSIS — Z01812 Encounter for preprocedural laboratory examination: Secondary | ICD-10-CM | POA: Insufficient documentation

## 2018-11-30 DIAGNOSIS — O0990 Supervision of high risk pregnancy, unspecified, unspecified trimester: Secondary | ICD-10-CM

## 2018-11-30 DIAGNOSIS — O34219 Maternal care for unspecified type scar from previous cesarean delivery: Secondary | ICD-10-CM

## 2018-11-30 DIAGNOSIS — Z3A38 38 weeks gestation of pregnancy: Secondary | ICD-10-CM

## 2018-11-30 LAB — CBC
HCT: 36.3 % (ref 36.0–46.0)
Hemoglobin: 11.9 g/dL — ABNORMAL LOW (ref 12.0–15.0)
MCH: 29.8 pg (ref 26.0–34.0)
MCHC: 32.8 g/dL (ref 30.0–36.0)
MCV: 90.8 fL (ref 80.0–100.0)
Platelets: 176 10*3/uL (ref 150–400)
RBC: 4 MIL/uL (ref 3.87–5.11)
RDW: 14.6 % (ref 11.5–15.5)
WBC: 8.4 10*3/uL (ref 4.0–10.5)
nRBC: 0 % (ref 0.0–0.2)

## 2018-11-30 NOTE — Progress Notes (Signed)
Released to mychart

## 2018-11-30 NOTE — H&P (View-Only) (Signed)
**Note Ariana-Identified via Obfuscation**  Patient ID: Ariana Waters, female   DOB: 10/22/1986, 33 y.o.   MRN: [id number]  Reason for Consult: Pre-op Exam   Referred by No ref. provider found  Subjective:     HPI:  Ariana Quella S Speedy is a 33 y.o. female . She is feeling well. Reports normal fetal movement. Denies leakage of fluid. Denies vaginal bleeding.  She is scheduled for repeat cesarean section tomorrow. Her pregnancy has been complicated by maternal obesity, and history of previous cesarean section, and previous abdominoplasty.   pregnancy #2 Problems (from 04/21/18 to present)    Problem Noted Resolved   Previous cesarean section complicating pregnancy 10/30/2018 by Aspynn Clover R, MD No   Obesity affecting pregnancy 07/01/2018 by Jackson, Stephen D, MD No   BMI 30.0-30.9,adult 07/01/2018 by Jackson, Stephen D, MD No   Supervision of high risk pregnancy, antepartum 04/24/2018 by Gutierrez, Colleen, CNM No   Overview Addendum 11/19/2018  3:11 PM by Gutierrez, Colleen, CNM    Clinic [facility] Prenatal Labs  Dating  LMP=7wk US Blood type: A/Positive/-- (05/28 1045)   Genetic Screen 1 Screen:  normal Antibody:Negative (05/28 1045)  Anatomic US  complete Rubella: 1.15 (05/28 1045)  Varicella: VI  GTT Early: 97 Third trimester: 123 RPR: Non Reactive (05/28 1045)   Rhogam Not applicable HBsAg: Negative (05/28 1045) neg  TDaP vaccine      10/02/18                  Flu Shot: HIV: Non Reactive (05/28 1045)   Baby Food  bottle                          GBS: negative  Contraception  Liletta Pap: 2019 LSIL HPV +  CBB   AA hemoglobin  CS/VBAC  cesarean   Support Person           Hx of abdominoplasty 04/21/2018 by Gutierrez, Colleen, CNM No   Obesity (BMI 30.0-34.9) 04/21/2018 by Gutierrez, Colleen, CNM No       Past Medical History:  Diagnosis Date  . Hiatal hernia   . History of abnormal cervical Pap smear 2010/ 2013  . Obesity (BMI 30.0-34.9)   . Previous cesarean section complicating pregnancy    Family  History  Problem Relation Age of Onset  . Breast cancer Maternal Aunt 58  . Cancer Paternal Aunt        unknown primary/ paternal great aunt  . Thyroid disease Maternal Aunt   . Stroke Neg Hx    Past Surgical History:  Procedure Laterality Date  . ABDOMINOPLASTY  2017  . CESAREAN SECTION  11/14/2006  . COLPOSCOPY  ?2010  . ESOPHAGOGASTRODUODENOSCOPY  01/2014    Short Social History:  Social History   Tobacco Use  . Smoking status: Never Smoker  . Smokeless tobacco: Never Used  Substance Use Topics  . Alcohol use: Yes    Frequency: Never    Comment: rare    Allergies  Allergen Reactions  . Penicillins Hives and Swelling    DID THE REACTION INVOLVE: Swelling of the face/tongue/throat, SOB, or low BP? Yes Sudden or severe rash/hives, skin peeling, or the inside of the mouth or nose? Unknown Did it require medical treatment? Yes When did it last happen?Occurred when patient was 33 years old If all above answers are "NO", may proceed with cephalosporin use.   . Lemon Oil Rash    Current Outpatient Medications  Medication Sig   Dispense Refill  . ferrous sulfate (FERROUSUL) 325 (65 FE) MG tablet Take 1 tablet (325 mg total) by mouth daily with breakfast. 30 tablet 1  . Prenatal Multivit-Min-Fe-FA (PRE-NATAL PO) Take 1 tablet by mouth daily.      No current facility-administered medications for this visit.     Review of Systems  Constitutional: Negative for chills, fatigue, fever and unexpected weight change.  HENT: Negative for trouble swallowing.  Eyes: Negative for loss of vision.  Respiratory: Negative for cough, shortness of breath and wheezing.  Cardiovascular: Negative for chest pain, leg swelling, palpitations and syncope.  GI: Negative for abdominal pain, blood in stool, diarrhea, nausea and vomiting.  GU: Negative for difficulty urinating, dysuria, frequency and hematuria.  Musculoskeletal: Negative for back pain, leg pain and joint pain.  Skin: Negative for  rash.  Neurological: Negative for dizziness, headaches, light-headedness, numbness and seizures.  Psychiatric: Negative for behavioral problem, confusion, depressed mood and sleep disturbance.        Objective:  Objective   Vitals:   11/30/18 0914  BP: 112/72  Pulse: 77  Weight: 219 lb (99.3 kg)  Height: 5\' 4"  (1.626 m)   Body mass index is 37.59 kg/m.  Physical Exam Vitals signs and nursing note reviewed.  Constitutional:      Appearance: She is well-developed.  HENT:     Head: Normocephalic and atraumatic.  Eyes:     Pupils: Pupils are equal, round, and reactive to light.  Cardiovascular:     Rate and Rhythm: Normal rate and regular rhythm.  Pulmonary:     Effort: Pulmonary effort is normal. No respiratory distress.  Skin:    General: Skin is warm and dry.  Neurological:     Mental Status: She is alert and oriented to person, place, and time.  Psychiatric:        Behavior: Behavior normal.        Thought Content: Thought content normal.        Judgment: Judgment normal.    FHR:  134 bpm      Assessment/Plan:     33yo [id number] 1. Repeat cesarean section scheduled for 12/01/2018- discussed risks benefits and alternatives with patient. She is aware of the risks of infection bleeding hemorrhage damage to surrounding tissue including bowel, bladder, fallopian tubes, ovaries, and uterus. She understands possible complications of blood transfusion, and risk of cesarean hysterectomy.  She understands these risks and plans to proceed with repeat cesarean section tomorrow.   Adelene Idlerhristanna Estephan Gallardo MD [facility], [facility] 11/30/2018 11:45 AM

## 2018-11-30 NOTE — Progress Notes (Signed)
Patient ID: Ariana Waters, female   DOB: May 07, 1986, 33 y.o.   MRN: [id number]  Reason for Consult: Pre-op Exam   Referred by No ref. provider found  Subjective:     HPI:  Ariana Waters is a 33 y.o. female . She is feeling well. Reports normal fetal movement. Denies leakage of fluid. Denies vaginal bleeding.  She is scheduled for repeat cesarean section tomorrow. Her pregnancy has been complicated by maternal obesity, and history of previous cesarean section, and previous abdominoplasty.   pregnancy #2 Problems (from 04/21/18 to present)    Problem Noted Resolved   Previous cesarean section complicating pregnancy 10/30/2018 by Natale MilchSchuman,  R, MD No   Obesity affecting pregnancy 07/01/2018 by Conard NovakJackson, Stephen D, MD No   BMI 30.0-30.9,adult 07/01/2018 by Conard NovakJackson, Stephen D, MD No   Supervision of high risk pregnancy, antepartum 04/24/2018 by Farrel ConnersGutierrez, Colleen, CNM No   Overview Addendum 11/19/2018  3:11 PM by Farrel ConnersGutierrez, Colleen, CNM    Clinic [facility] Prenatal Labs  Dating  LMP=7wk US Blood type: A/Positive/-- (05/28 1045)   Genetic Screen 1 Screen:  normal Antibody:Negative (05/28 1045)  Anatomic US  complete Rubella: 1.15 (05/28 1045)  Varicella: VI  GTT Early: 97 Third trimester: 123 RPR: Non Reactive (05/28 1045)   Rhogam Not applicable HBsAg: Negative (05/28 1045) neg  TDaP vaccine      10/02/18                  Flu Shot: HIV: Non Reactive (05/28 1045)   Baby Food  bottle                          GBS: negative  Contraception  Liletta Pap: 2019 LSIL HPV +  CBB   AA hemoglobin  CS/VBAC  cesarean   Support Person           Hx of abdominoplasty 04/21/2018 by Farrel ConnersGutierrez, Colleen, CNM No   Obesity (BMI 30.0-34.9) 04/21/2018 by Farrel ConnersGutierrez, Colleen, CNM No       Past Medical History:  Diagnosis Date  . Hiatal hernia   . History of abnormal cervical Pap smear 2010/ 2013  . Obesity (BMI 30.0-34.9)   . Previous cesarean section complicating pregnancy    Family  History  Problem Relation Age of Onset  . Breast cancer Maternal Aunt 58  . Cancer Paternal Aunt        unknown primary/ paternal great aunt  . Thyroid disease Maternal Aunt   . Stroke Neg Hx    Past Surgical History:  Procedure Laterality Date  . ABDOMINOPLASTY  2017  . CESAREAN SECTION  11/14/2006  . COLPOSCOPY  ?2010  . ESOPHAGOGASTRODUODENOSCOPY  01/2014    Short Social History:  Social History   Tobacco Use  . Smoking status: Never Smoker  . Smokeless tobacco: Never Used  Substance Use Topics  . Alcohol use: Yes    Frequency: Never    Comment: rare    Allergies  Allergen Reactions  . Penicillins Hives and Swelling    DID THE REACTION INVOLVE: Swelling of the face/tongue/throat, SOB, or low BP? Yes Sudden or severe rash/hives, skin peeling, or the inside of the mouth or nose? Unknown Did it require medical treatment? Yes When did it last happen?Occurred when patient was 33 years old If all above answers are "NO", may proceed with cephalosporin use.   Luster Landsberg. Lemon Oil Rash    Current Outpatient Medications  Medication Sig  Dispense Refill  . ferrous sulfate (FERROUSUL) 325 (65 FE) MG tablet Take 1 tablet (325 mg total) by mouth daily with breakfast. 30 tablet 1  . Prenatal Multivit-Min-Fe-FA (PRE-NATAL PO) Take 1 tablet by mouth daily.      No current facility-administered medications for this visit.     Review of Systems  Constitutional: Negative for chills, fatigue, fever and unexpected weight change.  HENT: Negative for trouble swallowing.  Eyes: Negative for loss of vision.  Respiratory: Negative for cough, shortness of breath and wheezing.  Cardiovascular: Negative for chest pain, leg swelling, palpitations and syncope.  GI: Negative for abdominal pain, blood in stool, diarrhea, nausea and vomiting.  GU: Negative for difficulty urinating, dysuria, frequency and hematuria.  Musculoskeletal: Negative for back pain, leg pain and joint pain.  Skin: Negative for  rash.  Neurological: Negative for dizziness, headaches, light-headedness, numbness and seizures.  Psychiatric: Negative for behavioral problem, confusion, depressed mood and sleep disturbance.        Objective:  Objective   Vitals:   11/30/18 0914  BP: 112/72  Pulse: 77  Weight: 219 lb (99.3 kg)  Height: 5\' 4"  (1.626 m)   Body mass index is 37.59 kg/m.  Physical Exam Vitals signs and nursing note reviewed.  Constitutional:      Appearance: She is well-developed.  HENT:     Head: Normocephalic and atraumatic.  Eyes:     Pupils: Pupils are equal, round, and reactive to light.  Cardiovascular:     Rate and Rhythm: Normal rate and regular rhythm.  Pulmonary:     Effort: Pulmonary effort is normal. No respiratory distress.  Skin:    General: Skin is warm and dry.  Neurological:     Mental Status: She is alert and oriented to person, place, and time.  Psychiatric:        Behavior: Behavior normal.        Thought Content: Thought content normal.        Judgment: Judgment normal.    FHR:  134 bpm      Assessment/Plan:     32yo [id number] [id number] 1. Repeat cesarean section scheduled for 12/01/2018- discussed risks benefits and alternatives with patient. She is aware of the risks of infection bleeding hemorrhage damage to surrounding tissue including bowel, bladder, fallopian tubes, ovaries, and uterus. She understands possible complications of blood transfusion, and risk of cesarean hysterectomy.  She understands these risks and plans to proceed with repeat cesarean section tomorrow.   Adelene Idlerhristanna  MD [facility], [facility] 11/30/2018 11:45 AM

## 2018-11-30 NOTE — Patient Instructions (Signed)
Your procedure is scheduled on: Tuesday, December 01, 2018 Report to the Birth Place at 5:30 am. Enter through the emergency room entrance and they will direct you to the 3rd floor Birth Place.   REMEMBER: Instructions that are not followed completely may result in serious medical risk, up to and including death; or upon the discretion of your surgeon and anesthesiologist your surgery may need to be rescheduled.  Do not eat or drink after midnight the night before surgery.  No gum chewing, lozengers or hard candies.  No Alcohol for 24 hours before or after surgery.  No Smoking including e-cigarettes for 24 hours prior to surgery.  No chewable tobacco products for at least 6 hours prior to surgery.  No nicotine patches on the day of surgery.  On the morning of surgery brush your teeth with toothpaste and water, you may rinse your mouth with mouthwash if you wish. Do not swallow any toothpaste or mouthwash.  Notify your doctor if there is any change in your medical condition (cold, fever, infection).  Do not wear jewelry, make-up, hairpins, clips or nail polish.  Do not wear lotions, powders, or perfumes.   Do not shave 48 hours prior to surgery.   Contacts and dentures may not be worn into surgery.  Do not bring valuables to the hospital, including drivers license, insurance or credit cards.  [facility] is not responsible for any belongings or valuables.   TAKE THESE MEDICATIONS THE MORNING OF SURGERY:  none  Use CHG wipes as directed on instruction sheet.  NOW!  Stop Anti-inflammatories (NSAIDS) such as Advil, Aleve, Ibuprofen, Motrin, Naproxen, Naprosyn and Aspirin based products such as Excedrin, Goodys Powder, BC Powder. (May take Tylenol or Acetaminophen if needed.)  NOW!  Stop ANY OVER THE COUNTER supplements until after surgery. (May continue multivitamin.)  Please call [phone] if you have any questions about these instructions.

## 2018-11-30 NOTE — Pre-Procedure Instructions (Signed)
Secure chat message to Dr. Ellie Lunch office regarding patient allergy to PCN and that Ancef was ordered for surgical prophylaxis; message received back that Dr. Jerene Pitch still wants patient to receive Ancef as ordered.

## 2018-12-01 ENCOUNTER — Encounter
Admission: RE | Disposition: A | Discharge status: Home / Self Care | Payer: Self-pay | Source: Home / Self Care | Attending: Obstetrics and Gynecology

## 2018-12-01 ENCOUNTER — Inpatient Hospital Stay: Payer: Commercial Managed Care - PPO | Admitting: Anesthesiology

## 2018-12-01 ENCOUNTER — Inpatient Hospital Stay
Admission: RE | Admit: 2018-12-01 | Discharge: 2018-12-03 | DRG: 787 | Disposition: A | Discharge status: Home / Self Care | Payer: Commercial Managed Care - PPO | Attending: Obstetrics and Gynecology | Admitting: Obstetrics and Gynecology

## 2018-12-01 DIAGNOSIS — Z880 Allergy status to penicillin: Secondary | ICD-10-CM | POA: Diagnosis not present

## 2018-12-01 DIAGNOSIS — O99214 Obesity complicating childbirth: Secondary | ICD-10-CM | POA: Diagnosis not present

## 2018-12-01 DIAGNOSIS — Z3A38 38 weeks gestation of pregnancy: Secondary | ICD-10-CM

## 2018-12-01 DIAGNOSIS — Z3A39 39 weeks gestation of pregnancy: Secondary | ICD-10-CM

## 2018-12-01 DIAGNOSIS — O34211 Maternal care for low transverse scar from previous cesarean delivery: Principal | ICD-10-CM | POA: Diagnosis present

## 2018-12-01 DIAGNOSIS — E669 Obesity, unspecified: Secondary | ICD-10-CM | POA: Diagnosis present

## 2018-12-01 DIAGNOSIS — D62 Acute posthemorrhagic anemia: Secondary | ICD-10-CM | POA: Diagnosis not present

## 2018-12-01 DIAGNOSIS — O9081 Anemia of the puerperium: Secondary | ICD-10-CM | POA: Diagnosis not present

## 2018-12-01 DIAGNOSIS — Z98891 History of uterine scar from previous surgery: Secondary | ICD-10-CM

## 2018-12-01 LAB — ABO/RH: ABO/RH(D): A POS

## 2018-12-01 LAB — TYPE AND SCREEN
ABO/RH(D): A POS
Antibody Screen: NEGATIVE
Extend sample reason: UNDETERMINED

## 2018-12-01 SURGERY — Surgical Case
Anesthesia: Spinal

## 2018-12-01 MED ORDER — NALBUPHINE HCL 10 MG/ML IJ SOLN: 5.0000 mg | Freq: Once | INTRAMUSCULAR | Status: DC | PRN

## 2018-12-01 MED ORDER — OXYCODONE-ACETAMINOPHEN 5-325 MG PO TABS: 1.0000 | ORAL_TABLET | ORAL | Status: DC | PRN

## 2018-12-01 MED ORDER — DIBUCAINE 1 % RE OINT: 1.0000 "application " | TOPICAL_OINTMENT | RECTAL | Status: DC | PRN

## 2018-12-01 MED ORDER — IBUPROFEN 800 MG PO TABS
800.0000 mg | ORAL_TABLET | Freq: Three times a day (TID) | ORAL | Status: DC
  Administered 2018-12-02 – 2018-12-03 (×4): 800 mg via ORAL
  Filled 2018-12-01 (×4): qty 1

## 2018-12-01 MED ORDER — ACETAMINOPHEN 325 MG PO TABS
650.0000 mg | ORAL_TABLET | Freq: Four times a day (QID) | ORAL | Status: AC
  Administered 2018-12-01 (×2): 650 mg via ORAL
  Administered 2018-12-02: 325 mg via ORAL
  Filled 2018-12-01 (×3): qty 2

## 2018-12-01 MED ORDER — BISACODYL 10 MG RE SUPP
10.0000 mg | Freq: Every day | RECTAL | Status: DC | PRN
  Filled 2018-12-01: qty 1

## 2018-12-01 MED ORDER — HYDROMORPHONE HCL 1 MG/ML IJ SOLN: 0.2000 mg | INTRAMUSCULAR | Status: DC | PRN

## 2018-12-01 MED ORDER — LACTATED RINGERS IV SOLN: INTRAVENOUS | Status: DC

## 2018-12-01 MED ORDER — SODIUM CHLORIDE (PF) 0.9 % IJ SOLN
INTRAMUSCULAR | Status: DC | PRN
  Administered 2018-12-01: 10 mL

## 2018-12-01 MED ORDER — WITCH HAZEL-GLYCERIN EX PADS: 1.0000 "application " | MEDICATED_PAD | CUTANEOUS | Status: DC | PRN

## 2018-12-01 MED ORDER — FENTANYL CITRATE (PF) 100 MCG/2ML IJ SOLN
INTRAMUSCULAR | Status: DC | PRN
  Administered 2018-12-01: 15 ug via INTRAVENOUS

## 2018-12-01 MED ORDER — MEPERIDINE HCL 25 MG/ML IJ SOLN: 6.2500 mg | INTRAMUSCULAR | Status: DC | PRN

## 2018-12-01 MED ORDER — OXYTOCIN 40 UNITS IN NORMAL SALINE INFUSION - SIMPLE MED
2.5000 [IU]/h | INTRAVENOUS | Status: DC
  Administered 2018-12-01 (×2): 2.5 [IU]/h via INTRAVENOUS
  Filled 2018-12-01: qty 1000

## 2018-12-01 MED ORDER — NALBUPHINE HCL 10 MG/ML IJ SOLN: 5.0000 mg | INTRAMUSCULAR | Status: DC | PRN

## 2018-12-01 MED ORDER — DIPHENHYDRAMINE HCL 25 MG PO CAPS: 25.0000 mg | ORAL_CAPSULE | ORAL | Status: DC | PRN

## 2018-12-01 MED ORDER — OXYTOCIN 40 UNITS IN LACTATED RINGERS INFUSION - SIMPLE MED
INTRAVENOUS | Status: DC | PRN
  Administered 2018-12-01: 500 mL via INTRAVENOUS

## 2018-12-01 MED ORDER — SODIUM CHLORIDE 0.9% FLUSH: 3.0000 mL | INTRAVENOUS | Status: DC | PRN

## 2018-12-01 MED ORDER — SOD CITRATE-CITRIC ACID 500-334 MG/5ML PO SOLN
30.0000 mL | ORAL | Status: AC
  Administered 2018-12-01: 30 mL via ORAL
  Filled 2018-12-01: qty 30

## 2018-12-01 MED ORDER — ACETAMINOPHEN 500 MG PO TABS
500.0000 mg | ORAL_TABLET | Freq: Four times a day (QID) | ORAL | Status: DC
  Administered 2018-12-02 – 2018-12-03 (×5): 500 mg via ORAL
  Filled 2018-12-01 (×5): qty 1

## 2018-12-01 MED ORDER — BUPIVACAINE 0.25 % ON-Q PUMP DUAL CATH 400 ML
400.0000 mL | INJECTION | Status: DC
  Filled 2018-12-01: qty 400

## 2018-12-01 MED ORDER — KETOROLAC TROMETHAMINE 30 MG/ML IJ SOLN
30.0000 mg | Freq: Four times a day (QID) | INTRAMUSCULAR | Status: AC
  Administered 2018-12-01 – 2018-12-02 (×3): 30 mg via INTRAVENOUS
  Filled 2018-12-01 (×3): qty 1

## 2018-12-01 MED ORDER — OXYCODONE HCL 5 MG PO TABS: 5.0000 mg | ORAL_TABLET | ORAL | Status: DC | PRN

## 2018-12-01 MED ORDER — BUPIVACAINE HCL (PF) 0.25 % IJ SOLN
10.0000 mL | Freq: Once | INTRAMUSCULAR | Status: DC
  Filled 2018-12-01: qty 10

## 2018-12-01 MED ORDER — ACETAMINOPHEN 325 MG PO TABS
650.0000 mg | ORAL_TABLET | Freq: Four times a day (QID) | ORAL | Status: DC
  Administered 2018-12-01: 650 mg via ORAL
  Filled 2018-12-01: qty 2

## 2018-12-01 MED ORDER — BUPIVACAINE HCL (PF) 0.5 % IJ SOLN
INTRAMUSCULAR | Status: AC
  Filled 2018-12-01: qty 30

## 2018-12-01 MED ORDER — DIPHENHYDRAMINE HCL 25 MG PO CAPS: 25.0000 mg | ORAL_CAPSULE | Freq: Four times a day (QID) | ORAL | Status: DC | PRN

## 2018-12-01 MED ORDER — COCONUT OIL OIL: 1.0000 "application " | TOPICAL_OIL | Status: DC | PRN

## 2018-12-01 MED ORDER — FENTANYL CITRATE (PF) 100 MCG/2ML IJ SOLN
INTRAMUSCULAR | Status: AC
  Filled 2018-12-01: qty 2

## 2018-12-01 MED ORDER — ZOLPIDEM TARTRATE 5 MG PO TABS: 5.0000 mg | ORAL_TABLET | Freq: Every evening | ORAL | Status: DC | PRN

## 2018-12-01 MED ORDER — OXYCODONE HCL 5 MG PO TABS: 10.0000 mg | ORAL_TABLET | ORAL | Status: DC | PRN

## 2018-12-01 MED ORDER — SENNOSIDES-DOCUSATE SODIUM 8.6-50 MG PO TABS
2.0000 | ORAL_TABLET | ORAL | Status: DC
  Administered 2018-12-02 – 2018-12-03 (×2): 2 via ORAL
  Filled 2018-12-01 (×2): qty 2

## 2018-12-01 MED ORDER — BUPIVACAINE IN DEXTROSE 0.75-8.25 % IT SOLN
INTRATHECAL | Status: DC | PRN
  Administered 2018-12-01: 1.6 mL via INTRATHECAL

## 2018-12-01 MED ORDER — MENTHOL 3 MG MT LOZG: 1.0000 | LOZENGE | OROMUCOSAL | Status: DC | PRN

## 2018-12-01 MED ORDER — PRENATAL MULTIVITAMIN CH
1.0000 | ORAL_TABLET | Freq: Every day | ORAL | Status: DC
  Administered 2018-12-02 – 2018-12-03 (×2): 1 via ORAL
  Filled 2018-12-01 (×2): qty 1

## 2018-12-01 MED ORDER — FLEET ENEMA 7-19 GM/118ML RE ENEM: 1.0000 | ENEMA | Freq: Every day | RECTAL | Status: DC | PRN

## 2018-12-01 MED ORDER — NALOXONE HCL 0.4 MG/ML IJ SOLN: 0.4000 mg | INTRAMUSCULAR | Status: DC | PRN

## 2018-12-01 MED ORDER — DIPHENHYDRAMINE HCL 50 MG/ML IJ SOLN: 12.5000 mg | INTRAMUSCULAR | Status: DC | PRN

## 2018-12-01 MED ORDER — KETOROLAC TROMETHAMINE 30 MG/ML IJ SOLN: 30.0000 mg | Freq: Four times a day (QID) | INTRAMUSCULAR | Status: DC

## 2018-12-01 MED ORDER — BUPIVACAINE ON-Q PAIN PUMP (FOR ORDER SET NO CHG)
INJECTION | Status: DC
  Filled 2018-12-01: qty 1

## 2018-12-01 MED ORDER — SODIUM CHLORIDE 0.9 % IV SOLN
INTRAVENOUS | Status: DC | PRN
  Administered 2018-12-01: 50 ug/min via INTRAVENOUS

## 2018-12-01 MED ORDER — SIMETHICONE 80 MG PO CHEW
80.0000 mg | CHEWABLE_TABLET | ORAL | Status: DC | PRN
  Administered 2018-12-01: 80 mg via ORAL
  Filled 2018-12-01: qty 1

## 2018-12-01 MED ORDER — KETOROLAC TROMETHAMINE 30 MG/ML IJ SOLN
30.0000 mg | Freq: Four times a day (QID) | INTRAMUSCULAR | Status: DC
  Administered 2018-12-01: 30 mg via INTRAVENOUS
  Filled 2018-12-01: qty 1

## 2018-12-01 MED ORDER — ONDANSETRON HCL 4 MG/2ML IJ SOLN
INTRAMUSCULAR | Status: DC | PRN
  Administered 2018-12-01: 4 mg via INTRAVENOUS

## 2018-12-01 MED ORDER — MORPHINE SULFATE (PF) 0.5 MG/ML IJ SOLN
INTRAMUSCULAR | Status: DC | PRN
  Administered 2018-12-01: .1 mg via EPIDURAL

## 2018-12-01 MED ORDER — FERROUS SULFATE 325 (65 FE) MG PO TABS
325.0000 mg | ORAL_TABLET | Freq: Two times a day (BID) | ORAL | Status: DC
  Administered 2018-12-01 – 2018-12-03 (×4): 325 mg via ORAL
  Filled 2018-12-01 (×4): qty 1

## 2018-12-01 MED ORDER — CEFAZOLIN SODIUM-DEXTROSE 2-4 GM/100ML-% IV SOLN
2.0000 g | INTRAVENOUS | Status: AC
  Administered 2018-12-01: 2 g via INTRAVENOUS
  Filled 2018-12-01: qty 100

## 2018-12-01 MED ORDER — SIMETHICONE 80 MG PO CHEW
80.0000 mg | CHEWABLE_TABLET | Freq: Three times a day (TID) | ORAL | Status: DC
  Administered 2018-12-01 – 2018-12-03 (×5): 80 mg via ORAL
  Filled 2018-12-01 (×5): qty 1

## 2018-12-01 MED ORDER — LACTATED RINGERS IV SOLN
INTRAVENOUS | Status: DC
  Administered 2018-12-01 (×2): via INTRAVENOUS

## 2018-12-01 MED ORDER — OXYTOCIN 40 UNITS IN LACTATED RINGERS INFUSION - SIMPLE MED
INTRAVENOUS | Status: AC
  Filled 2018-12-01: qty 1000

## 2018-12-01 MED ORDER — ONDANSETRON HCL 4 MG/2ML IJ SOLN
INTRAMUSCULAR | Status: AC
  Filled 2018-12-01: qty 2

## 2018-12-01 MED ORDER — ONDANSETRON HCL 4 MG/2ML IJ SOLN: 4.0000 mg | Freq: Three times a day (TID) | INTRAMUSCULAR | Status: DC | PRN

## 2018-12-01 MED ORDER — SIMETHICONE 80 MG PO CHEW
80.0000 mg | CHEWABLE_TABLET | ORAL | Status: DC
  Administered 2018-12-02: 80 mg via ORAL
  Filled 2018-12-01: qty 1

## 2018-12-01 MED ORDER — MORPHINE SULFATE (PF) 0.5 MG/ML IJ SOLN
INTRAMUSCULAR | Status: AC
  Filled 2018-12-01: qty 10

## 2018-12-01 MED ORDER — KETOROLAC TROMETHAMINE 30 MG/ML IJ SOLN: 30.0000 mg | Freq: Four times a day (QID) | INTRAMUSCULAR | Status: AC

## 2018-12-01 SURGICAL SUPPLY — 23 items
CANISTER SUCT 3000ML PPV (MISCELLANEOUS) ×2 IMPLANT
CHLORAPREP W/TINT 26ML (MISCELLANEOUS) ×4 IMPLANT
DERMABOND ADVANCED (GAUZE/BANDAGES/DRESSINGS) ×1
DERMABOND ADVANCED .7 DNX12 (GAUZE/BANDAGES/DRESSINGS) ×1 IMPLANT
DRSG OPSITE POSTOP 4X10 (GAUZE/BANDAGES/DRESSINGS) ×2 IMPLANT
ELECT CAUTERY BLADE 6.4 (BLADE) ×2 IMPLANT
ELECT REM PT RETURN 9FT ADLT (ELECTROSURGICAL) ×2
ELECTRODE REM PT RTRN 9FT ADLT (ELECTROSURGICAL) ×1 IMPLANT
GLOVE BIOGEL PI IND STRL 6.5 (GLOVE) ×1 IMPLANT
GLOVE BIOGEL PI INDICATOR 6.5 (GLOVE) ×1
GOWN STRL REUS W/ TWL LRG LVL3 (GOWN DISPOSABLE) ×1 IMPLANT
GOWN STRL REUS W/ TWL XL LVL3 (GOWN DISPOSABLE) ×2 IMPLANT
GOWN STRL REUS W/TWL LRG LVL3 (GOWN DISPOSABLE) ×1
GOWN STRL REUS W/TWL XL LVL3 (GOWN DISPOSABLE) ×2
NS IRRIG 1000ML POUR BTL (IV SOLUTION) ×2 IMPLANT
PACK C SECTION AR (MISCELLANEOUS) ×2 IMPLANT
PAD OB MATERNITY 4.3X12.25 (PERSONAL CARE ITEMS) ×2 IMPLANT
PAD PREP 24X41 OB/GYN DISP (PERSONAL CARE ITEMS) ×2 IMPLANT
SUT MNCRL AB 4-0 PS2 18 (SUTURE) ×2 IMPLANT
SUT PLAIN 3-0 (SUTURE) ×2 IMPLANT
SUT VIC AB 0 CT1 36 (SUTURE) ×6 IMPLANT
SUT VIC AB 2-0 CT1 36 (SUTURE) ×2 IMPLANT
SYR 30ML LL (SYRINGE) ×4 IMPLANT

## 2018-12-01 NOTE — Discharge Instructions (Signed)
Feeding: Bottle    Support head with chin tucked. Keep arms forward. Pillow may be used behind child to improve support. Position bottle at midline. Bend hips by resting child's bottom between your thighs. May use other arm to separate legs. Child should not arch head, neck or back.  Copyright  VHI. All rights reserved.  Cesarean Delivery, Care After This sheet gives you information about how to care for yourself after your procedure. Your health care provider may also give you more specific instructions. If you have problems or questions, contact your health care provider. What can I expect after the procedure? After the procedure, it is common to have:  A small amount of blood or clear fluid coming from the incision.  Some redness, swelling, and pain in your incision area.  Some abdominal pain and soreness.  Vaginal bleeding (lochia). Even though you did not have a vaginal delivery, you will still have vaginal bleeding and discharge.  Pelvic cramps.  Fatigue. You may have pain, swelling, and discomfort in the tissue between your vagina and your anus (perineum) if:  Your C-section was unplanned, and you were allowed to labor and push.  An incision was made in the area (episiotomy) or the tissue tore during attempted vaginal delivery. Follow these instructions at home: Incision care   Follow instructions from your health care provider about how to take care of your incision. Make sure you: ? Wash your hands with soap and water before you change your bandage (dressing). If soap and water are not available, use hand sanitizer. ? If you have a dressing, change it or remove it as told by your health care provider. ? Leave stitches (sutures), skin staples, skin glue, or adhesive strips in place. These skin closures may need to stay in place for 2 weeks or longer. If adhesive strip edges start to loosen and curl up, you may trim the loose edges. Do not remove adhesive strips  completely unless your health care provider tells you to do that.  Check your incision area every day for signs of infection. Check for: ? More redness, swelling, or pain. ? More fluid or blood. ? Warmth. ? Pus or a bad smell.  Do not take baths, swim, or use a hot tub until your health care provider says it's okay. Ask your health care provider if you can take showers.  When you cough or sneeze, hug a pillow. This helps with pain and decreases the chance of your incision opening up (dehiscing). Do this until your incision heals. Medicines  Take over-the-counter and prescription medicines only as told by your health care provider.  If you were prescribed an antibiotic medicine, take it as told by your health care provider. Do not stop taking the antibiotic even if you start to feel better.  Do not drive or use heavy machinery while taking prescription pain medicine. Lifestyle  Do not drink alcohol. This is especially important if you are breastfeeding or taking pain medicine.  Do not use any products that contain nicotine or tobacco, such as cigarettes, e-cigarettes, and chewing tobacco. If you need help quitting, ask your health care provider. Eating and drinking  Drink at least 8 eight-ounce glasses of water every day unless told not to by your health care provider. If you breastfeed, you may need to drink even more water.  Eat high-fiber foods every day. These foods may help prevent or relieve constipation. High-fiber foods include: ? Whole grain cereals and breads. ? Brown rice. ?  Beans. ? Fresh fruits and vegetables. Activity   If possible, have someone help you care for your baby and help with household activities for at least a few days after you leave the hospital.  Return to your normal activities as told by your health care provider. Ask your health care provider what activities are safe for you.  Rest as much as possible. Try to rest or take a nap while your baby is  sleeping.  Do not lift anything that is heavier than 10 lbs (4.5 kg), or the limit that you were told, until your health care provider says that it is safe.  Talk with your health care provider about when you can engage in sexual activity. This may depend on your: ? Risk of infection. ? How fast you heal. ? Comfort and desire to engage in sexual activity. General instructions  Do not use tampons or douches until your health care provider approves.  Wear loose, comfortable clothing and a supportive and well-fitting bra.  Keep your perineum clean and dry. Wipe from front to back when you use the toilet.  If you pass a blood clot, save it and call your health care provider to discuss. Do not flush blood clots down the toilet before you get instructions from your health care provider.  Keep all follow-up visits for you and your baby as told by your health care provider. This is important. Contact a health care provider if:  You have: ? A fever. ? Bad-smelling vaginal discharge. ? Pus or a bad smell coming from your incision. ? Difficulty or pain when urinating. ? A sudden increase or decrease in the frequency of your bowel movements. ? More redness, swelling, or pain around your incision. ? More fluid or blood coming from your incision. ? A rash. ? Nausea. ? Little or no interest in activities you used to enjoy. ? Questions about caring for yourself or your baby.  Your incision feels warm to the touch.  Your breasts turn red or become painful or hard.  You feel unusually sad or worried.  You vomit.  You pass a blood clot from your vagina.  You urinate more than usual.  You are dizzy or light-headed. Get help right away if:  You have: ? Pain that does not go away or get better with medicine. ? Chest pain. ? Difficulty breathing. ? Blurred vision or spots in your vision. ? Thoughts about hurting yourself or your baby. ? New pain in your abdomen or in one of your  legs. ? A severe headache.  You faint.  You bleed from your vagina so much that you fill more than one sanitary pad in one hour. Bleeding should not be heavier than your heaviest period. Summary  After the procedure, it is common to have pain at your incision site, abdominal cramping, and slight bleeding from your vagina.  Check your incision area every day for signs of infection.  Tell your health care provider about any unusual symptoms.  Keep all follow-up visits for you and your baby as told by your health care provider. This information is not intended to replace advice given to you by your health care provider. Make sure you discuss any questions you have with your health care provider. Document Released: 08/03/2002 Document Revised: 05/20/2018 Document Reviewed: 05/20/2018 Elsevier Interactive Patient Education  2019 [employer]

## 2018-12-01 NOTE — Op Note (Signed)
Cesarean Section Procedure Note 12/01/18  Pre-operative Diagnosis:  1. [redacted] weeks gestation  2. History of prior LTCS Post-operative Diagnosis: same, delivered. Procedure: Repeat Low Transverse Cesarean Section Surgeon: Adelene Idler MD   Assistant(s): Annamarie Major MD - No other skilled surgical assistant available. Anesthesia: spinal Estimated Blood Loss: 800cc Complications: None; patient tolerated the procedure well.   Disposition: PACU - hemodynamically stable. Condition: stable   Findings: A female infant in the cephalic presentation. Amniotic fluid - clear   Birth weight: 7 lbs 9oz Apgars of 9 and 9.  Intact placenta with a three-vessel cord. Grossly normal uterus, tubes and ovaries bilaterally. No intraabdominal adhesions were noted.   Procedure Details    The patient was taken to operating room, identified as the correct patient and the procedure verified as C-Section Delivery. A time out was held and the above information confirmed. After induction of anesthesia, the patient was draped and prepped in the usual sterile manner. A Pfannenstiel incision was made and carried down through the subcutaneous tissue to the fascia. Fascial incision was made and extended transversely with the Mayo scissors. The fascia was separated from the underlying rectus tissue superiorly and inferiorly. The peritoneum was identified and entered bluntly. Peritoneal incision was extended longitudinally. A bladder flap was made with the Metzenbaum scissors and a low transverse hysterotomy was made. The fetus was delivered atraumatically. The umbilical cord was clamped x2 and cut and the infant was handed to the awaiting pediatricians. The placenta was removed intact and appeared normal with a 3-vessel cord. The uterus was exteriorized and cleared of all clot and debris. The hysterotomy was closed with running sutures of 0 Vicryl suture. Excellent hemostasis was observed. The uterus was returned to the  abdomen. The pelvis was irrigated and again, excellent hemostasis was noted. The peritoneum was closed with a running stitch of 2-0 Vicryl. The On Q Pain pump System was then placed.  Trocars were placed through the abdominal wall into the subfascial space and these were used to thread the silver soaker cathaters into place.The rectus muscles were inspected and were hemostatic. The rectus fascia was then reapproximated with running sutures of 0-vicryl, with careful placement not to incorporate the cathaters. Subcutaneous tissues are then irrigated with saline and hemostasis assured with the bovie. Skin was then closed with 4-0 monocryl suture in a subcuticular fashion followed by skin adhesive. The cathaters are flushed each with 5 mL of Bupivicaine and stabilized into place with dressing. Instrument, sponge, and needle counts were correct prior to the abdominal closure and at the conclusion of the case.  The patient tolerated the procedure well and was transferred to the recovery room in stable condition.   Natale Milch MD [facility], [facility] 12/01/18 9:14 AM

## 2018-12-01 NOTE — Transfer of Care (Signed)
Immediate Anesthesia Transfer of Care Note  Patient: Ariana BlowerDe Quella S Fluegge  Procedure(s) Performed: CESAREAN SECTION (N/A )  Patient Location: PACU  Anesthesia Type:Spinal  Level of Consciousness: awake, alert  and oriented  Airway & Oxygen Therapy: Patient Spontanous Breathing  Post-op Assessment: Post -op Vital signs reviewed and stable  Post vital signs: stable  Last Vitals:  Vitals Value Taken Time  BP 123/80 12/01/2018  9:19 AM  Temp 36.4 C 12/01/2018  9:19 AM  Pulse 61 12/01/2018  9:19 AM  Resp 12 12/01/2018  9:19 AM  SpO2 100 % 12/01/2018  9:19 AM    Last Pain:  Vitals:   12/01/18 0919  TempSrc: Oral  PainSc:          Complications: No apparent anesthesia complications

## 2018-12-01 NOTE — Discharge Summary (Addendum)
OB Discharge Summary     Patient Name: Ariana Waters DOB: 04/29/1986 MRN: [id number]  Date of admission: 12/01/2018 Delivering MD: Natale Milchhristanna R Schuman, MD  Date of Delivery: 12/01/2018  Date of discharge: 12/03/2018  Admitting diagnosis: PRIOR CESAREAN HISTORY OF ABDOMINOPLASTY Intrauterine pregnancy: 7167w0d     Secondary diagnosis: Anemia     Discharge diagnosis: Term Pregnancy Delivered                     Hospital course:  Sceduled C/S   33 y.o. yo [id number] at 3267w0d was admitted to the hospital 12/01/2018 for scheduled cesarean section with the following indication:Elective Repeat.  Membrane Rupture Time/Date: 8:22 AM ,12/01/2018   Patient delivered a Viable infant.12/01/2018  Details of operation can be found in separate operative note.  Patient had an uncomplicated postpartum course.  She is ambulating, tolerating a regular diet, passing flatus, and urinating well. Patient is discharged home in stable condition on  12/03/18                                                                        Post partum procedures: none  Complications: None  Physical exam on 12/03/2018: Vitals:   12/02/18 0855 12/02/18 1540 12/02/18 2351 12/03/18 0801  BP:  101/68 119/79 113/73  Pulse: 78 86 73 77  Resp:  18 18 20   Temp:  98.2 F (36.8 C) 97.6 F (36.4 C) 98 F (36.7 C)  TempSrc:  Oral Oral Oral  SpO2: 97%  100% 99%  Weight:      Height:       General: alert, cooperative and no distress Lochia: appropriate Uterine Fundus: firm Incision: Healing well with no significant drainage DVT Evaluation: No evidence of DVT seen on physical exam.  Labs: Lab Results  Component Value Date   WBC 10.7 (H) 12/02/2018   HGB 9.0 (L) 12/02/2018   HCT 26.8 (L) 12/02/2018   MCV 90.2 12/02/2018   PLT 151 12/02/2018   No flowsheet data found.  Discharge instruction: per After Visit Summary.  Medications:  Allergies as of 12/03/2018      Reactions   Penicillins Hives, Swelling   DID THE REACTION  INVOLVE: Swelling of the face/tongue/throat, SOB, or low BP? Yes Sudden or severe rash/hives, skin peeling, or the inside of the mouth or nose? Unknown Did it require medical treatment? Yes When did it last happen?Occurred when patient was 33 years old If all above answers are "NO", may proceed with cephalosporin use.   Lemon Oil Rash      Medication List    TAKE these medications   ferrous sulfate 325 (65 FE) MG tablet Commonly known as:  FERROUSUL Take 1 tablet (325 mg total) by mouth daily with breakfast.   oxyCODONE 5 MG immediate release tablet Commonly known as:  Oxy IR/ROXICODONE Take 1 tablet (5 mg total) by mouth every 6 (six) hours as needed for up to 5 days for moderate pain or severe pain.   PRE-NATAL PO Take 1 tablet by mouth daily.            Discharge Care Instructions  (From admission, onward)         Start     Ordered  12/03/18 0000  Discharge wound care:    Comments:  Keep incision dry, clean.   12/03/18 1032          Diet: routine diet  Activity: Advance as tolerated. Pelvic rest for 6 weeks.   Outpatient follow up: Follow-up Information    Schuman, Jaquelyn Bitterhristanna R, MD In 1 week.   Specialty:  Obstetrics and Gynecology Contact information: [address] [place] [id number] [phone]             Postpartum contraception: Reviewed options: Undecided Rhogam Given postpartum: NA Rubella vaccine given postpartum: Rubella Immune Varicella vaccine given postpartum: Varicella Immune TDaP given antepartum or postpartum: Yes  Newborn Data: Live born female  Birth Weight: 7 lb 9.7 oz (3450 g) APGAR: 9, 9  Newborn Delivery   Birth date/time:  12/01/2018 08:23:00 Delivery type:  C-Section, Low Transverse Trial of labor:  No C-section categorization:  Repeat      Baby Feeding: Formula  Disposition:home with mother  SIGNED:  Tresea MallJane Tilda Samudio, CNM [facility], [facility]

## 2018-12-01 NOTE — Anesthesia Procedure Notes (Signed)
Spinal  Patient location during procedure: OR Start time: 12/01/2018 7:48 AM End time: 12/01/2018 8:02 AM Staffing Anesthesiologist: Emmie Niemann, MD Resident/CRNA: Aline Brochure, CRNA Performed: resident/CRNA and anesthesiologist  Preanesthetic Checklist Completed: patient identified, site marked, surgical consent, pre-op evaluation, timeout performed, IV checked, risks and benefits discussed and monitors and equipment checked Spinal Block Patient position: sitting Prep: ChloraPrep Patient monitoring: heart rate, continuous pulse ox, blood pressure and cardiac monitor Approach: midline Location: L4-5 Injection technique: single-shot Needle Needle type: Introducer and Pencil-Tip  Needle gauge: 24 G Needle length: 9 cm Additional Notes Negative paresthesia. Negative blood return. Positive free-flowing CSF. Expiration date of kit checked and confirmed. Patient tolerated procedure well, without complications.

## 2018-12-01 NOTE — Anesthesia Preprocedure Evaluation (Signed)
Anesthesia Evaluation  Patient identified by MRN, date of birth, ID band Patient awake    Reviewed: Allergy & Precautions, NPO status , Patient's Chart, lab work & pertinent test results  History of Anesthesia Complications Negative for: history of anesthetic complications  Airway Mallampati: II  TM Distance: >3 FB Neck ROM: Full    Dental no notable dental hx.    Pulmonary neg pulmonary ROS, neg sleep apnea, neg COPD,    breath sounds clear to auscultation- rhonchi (-) wheezing      Cardiovascular Exercise Tolerance: Good (-) hypertension(-) CAD and (-) Past MI  Rhythm:Regular Rate:Normal - Systolic murmurs and - Diastolic murmurs    Neuro/Psych negative neurological ROS  negative psych ROS   GI/Hepatic negative GI ROS, Neg liver ROS,   Endo/Other  negative endocrine ROSneg diabetes  Renal/GU negative Renal ROS     Musculoskeletal negative musculoskeletal ROS (+)   Abdominal (+) + obese, Gravid abdomen   Peds  Hematology negative hematology ROS (+)   Anesthesia Other Findings Past Medical History: No date: Hiatal hernia 2010/ 2013: History of abnormal cervical Pap smear No date: Obesity (BMI 30.0-34.9) No date: Previous cesarean section complicating pregnancy   Reproductive/Obstetrics (+) Pregnancy                             Lab Results  Component Value Date   WBC 8.4 11/30/2018   HGB 11.9 (L) 11/30/2018   HCT 36.3 11/30/2018   MCV 90.8 11/30/2018   PLT 176 11/30/2018    Anesthesia Physical Anesthesia Plan  ASA: II  Anesthesia Plan: Spinal   Post-op Pain Management:    Induction:   PONV Risk Score and Plan: 2 and Ondansetron  Airway Management Planned: Natural Airway  Additional Equipment:   Intra-op Plan:   Post-operative Plan:   Informed Consent: I have reviewed the patients History and Physical, chart, labs and discussed the procedure including the risks,  benefits and alternatives for the proposed anesthesia with the patient or authorized representative who has indicated his/her understanding and acceptance.   Dental advisory given  Plan Discussed with: CRNA and Anesthesiologist  Anesthesia Plan Comments:         Anesthesia Quick Evaluation

## 2018-12-01 NOTE — Interval H&P Note (Signed)
History and Physical Interval Note:  12/01/2018 7:23 AM  Ariana Waters  has presented today for surgery, with the diagnosis of PRIOR CESAREAN HISTORY OF ABDOMINOPLASTY  The various methods of treatment have been discussed with the patient and family. After consideration of risks, benefits and other options for treatment, the patient has consented to  Procedure(s): CESAREAN SECTION (N/A) as a surgical intervention .  The patient's history has been reviewed, patient examined, no change in status, stable for surgery.  I have reviewed the patient's chart and labs.  Questions were answered to the patient's satisfaction.     Christanna R Schuman

## 2018-12-01 NOTE — Anesthesia Post-op Follow-up Note (Signed)
Anesthesia QCDR form completed.        

## 2018-12-02 ENCOUNTER — Encounter: Payer: Self-pay | Admitting: Obstetrics and Gynecology

## 2018-12-02 ENCOUNTER — Telehealth: Payer: Self-pay

## 2018-12-02 LAB — CBC
HCT: 26.8 % — ABNORMAL LOW (ref 36.0–46.0)
Hemoglobin: 9 g/dL — ABNORMAL LOW (ref 12.0–15.0)
MCH: 30.3 pg (ref 26.0–34.0)
MCHC: 33.6 g/dL (ref 30.0–36.0)
MCV: 90.2 fL (ref 80.0–100.0)
Platelets: 151 10*3/uL (ref 150–400)
RBC: 2.97 MIL/uL — ABNORMAL LOW (ref 3.87–5.11)
RDW: 14.7 % (ref 11.5–15.5)
WBC: 10.7 10*3/uL — ABNORMAL HIGH (ref 4.0–10.5)
nRBC: 0 % (ref 0.0–0.2)

## 2018-12-02 MED ORDER — ACETAMINOPHEN 325 MG PO TABS
ORAL_TABLET | ORAL | Status: AC
  Administered 2018-12-02: 325 mg
  Filled 2018-12-02: qty 1

## 2018-12-02 NOTE — Anesthesia Postprocedure Evaluation (Signed)
Anesthesia Post Note  Patient: Ariana Waters  Procedure(s) Performed: CESAREAN SECTION (N/A )  Patient location during evaluation: Mother Baby Anesthesia Type: Spinal Level of consciousness: oriented and awake and alert Pain management: pain level controlled Vital Signs Assessment: post-procedure vital signs reviewed and stable Respiratory status: spontaneous breathing, respiratory function stable and patient connected to nasal cannula oxygen Cardiovascular status: blood pressure returned to baseline and stable Postop Assessment: no headache, no backache, no apparent nausea or vomiting and able to ambulate Anesthetic complications: no     Last Vitals:  Vitals:   12/02/18 0300 12/02/18 0500  BP:    Pulse: 72 67  Resp:    Temp:    SpO2: 97% 98%    Last Pain:  Vitals:   12/02/18 0510  TempSrc:   PainSc: 4                  Clanton Emanuelson,  Alessandra Bevels

## 2018-12-02 NOTE — Progress Notes (Signed)
POD#1 rLTCS Subjective:  Feeling well overall, but is having an increase in pain since yesterday due to being up and mobile. Discussed PRN medication for pain control. Ambulating well. Has passed flatus. Tolerating a regular diet with no nausea. Voiding normally.  Objective:  Blood pressure 103/63, pulse 78, temperature 98.3 F (36.8 C), temperature source Oral, resp. rate 20, height 5\' 4"  (1.626 m), weight 99.3 kg, last menstrual period 03/03/2018, SpO2 97 %, unknown if currently breastfeeding.  General: NAD Pulmonary: no increased work of breathing Abdomen: non-distended, non-tender, fundus firm, lochia appropriate Incision: C/D/I, On-Q in place Extremities: non-pitting edema BLE, no erythema, no tenderness  Results for orders placed or performed during the hospital encounter of 12/01/18 (from the past 72 hour(s))  ABO/Rh     Status: None   Collection Time: 12/01/18  6:43 AM  Result Value Ref Range   ABO/RH(D)      A POS Performed at [facility], [address] [place], [place] [address]   CBC     Status: Abnormal   Collection Time: 12/02/18  6:06 AM  Result Value Ref Range   WBC 10.7 (H) 4.0 - 10.5 K/uL   RBC 2.97 (L) 3.87 - 5.11 MIL/uL   Hemoglobin 9.0 (L) 12.0 - 15.0 g/dL   HCT 82.5 (L) 05.3 - 97.6 %   MCV 90.2 80.0 - 100.0 fL   MCH 30.3 26.0 - 34.0 pg   MCHC 33.6 30.0 - 36.0 g/dL   RDW 73.4 19.3 - 79.0 %   Platelets 151 150 - 400 K/uL   nRBC 0.0 0.0 - 0.2 %    Comment: Performed at [facility], [address], [place], [place] [address]     Assessment:   33 y.o. [id number] postoperative day # 1 recovering well.  Plan:  1) Acute blood loss anemia - hemodynamically stable and asymptomatic - PO ferrous sulfate and continue prenatal vitamins with iron.  2) Blood Type --/--/A POS Performed at [facility], [address] [place], [place] [address]  [phone]) / Rubella 1.15 (05/28 1045) / Varicella Immune  3) TDAP status: received  antepartum 10/02/2018  4) Formula feeding /Contraception: considering Liletta IUD  5) Disposition: continue postpartum care, anticipate discharge on POD #2 or 3.  Marcelyn Bruins, CNM 12/02/2018

## 2018-12-02 NOTE — Plan of Care (Signed)
Vs stable; up with assist and moving better; able to have iv converted to SL; has voided several times since her catheter was removed; tolerating regular diet; taking toradol IV and tylenol PO this shift for pain control; upcoming shift will be motrin and a different dose of tylenol for pain control; bottle feeding baby

## 2018-12-02 NOTE — Anesthesia Post-op Follow-up Note (Signed)
  Anesthesia Pain Follow-up Note  Patient: Ariana Waters  Day #: 1  Date of Follow-up: 12/02/2018 Time: 7:34 AM  Last Vitals:  Vitals:   12/02/18 0300 12/02/18 0500  BP:    Pulse: 72 67  Resp:    Temp:    SpO2: 97% 98%    Level of Consciousness: alert  Pain: mild   Side Effects:None  Catheter Site Exam:clean, dry, no drainage     Plan: D/C from anesthesia care at surgeon's request  Rica Mast

## 2018-12-02 NOTE — Lactation Note (Signed)
This note was copied from a baby's chart. Lactation Consultation Note  Patient Name: Girl Kyriana Wirz Today's Date: 12/02/2018     Maternal Data    Feeding Feeding Type: (attempted feed, infant sleepy per grandmother)  LATCH Score                   Interventions    Lactation Tools Discussed/Used     Consult Status  LC talked with patient about paced bottle feeding techniques as well as signs and symptoms of engorgement and mastitis and what to do if she's experiencing these symptoms. Mother had questions about feeding amounts and how much to feed infant. LC explained to read infant's feeding cues, and to start slow when increasing amount of formula to avoid overfeeding.   Arlyss Gandy 12/02/2018, 3:41 PM

## 2018-12-02 NOTE — Telephone Encounter (Signed)
Toniann Fail from [facility] calling for confirmation of delivery. [phone]  Adv delivered yesterday via c/s.

## 2018-12-03 MED ORDER — OXYCODONE HCL 5 MG PO TABS: 5.0000 mg | ORAL_TABLET | Freq: Four times a day (QID) | ORAL | 0 refills | Status: AC | PRN

## 2018-12-03 NOTE — Clinical Social Work Maternal (Signed)
CLINICAL SOCIAL WORK MATERNAL/CHILD NOTE  Patient Details  Name: Ariana Waters MRN: [id number] Date of Birth: 09/03/1986  Date:  12/03/2018  Clinical Social Worker Initiating Note:  York Spaniel MSW,LCSW Date/Time: Initiated:  12/03/18/      Child's Name:      Biological Parents:  Mother   Need for Interpreter:  None   Reason for Referral:  Other (Comment)(scored 9 on depression scale)   Address:  [address] [place] [address]    Phone number:  [phone] (home)     Additional phone number: none  Household Members/Support Persons (HM/SP):       HM/SP Name Relationship DOB or Age  HM/SP -1        HM/SP -2        HM/SP -3        HM/SP -4        HM/SP -5        HM/SP -6        HM/SP -7        HM/SP -8          Natural Supports (not living in the home):  Immediate Family, Friends   Herbalist: None   Employment:     Type of Work:     Education:      Homebound arranged:    Architect:  Media planner   Other Resources:      Cultural/Religious Considerations Which May Impact Care:  none  Strengths:  Ability to meet basic needs , Compliance with medical plan , Home prepared for child    Psychotropic Medications:         Pediatrician:       Pediatrician List:   [facility] [place]      Pediatrician Fax Number:    Risk Factors/Current Problems:  Mental Health Concerns    Cognitive State:  Alert , Able to Concentrate    Mood/Affect:  Tearful , Constricted , Overwhelmed    CSW Assessment: CSW consulted to see patient due to patient having scored a 9 on the depression scale. CSW went to see patient and her mother (infants maternal grandmother) was present. Patient gave approval for her mother to remain in the room. CSW introduced self and explained role and purpose of visit. Patient kept her eyes closed for in the beginning  of the assessment but when her mother spoke up, she began crying. Her mother provided her comfort and reassurance and when prompted, patient informed CSW that she was overwhelmed. She had initially not wanted to keep her baby and made the decision to keep the baby. She stated that this pregnancy was unexpected and she is scared of juggling her 32 year old with her newborn. Patient stated she knows about postpartum depression but denies having had it in the past with her 32 year old son. CSW, patient, and her mother worked on a plan for support for patient should she be home and concerned. Patient's mother verbalized that she could come stay with her and that she has a sister in [place] (who is helping with her 32 year old at times) that she could call upon. Patient's mother will be in town (she lives in [place]) through Monday of next week.Patient calmed down and stopped crying once a plan had been discussed for resources. Patient's nurse will also provide the perinatal  mood disorders resources for discharge.   Patient vocalized that she has all necessities for her newborn and that she has no concerns regarding transportation. She works full time and is on maternity leave.   CSW Plan/Description:  Other Information/Referral to [employer], [place] 12/03/2018, 11:48 AM

## 2018-12-03 NOTE — Progress Notes (Signed)
Provided and reviewed discharge paperwork and prescriptions. Verified understanding by use of teach back method, pt verbalized understanding as well. Follow up appointment provided. Also provided list of resources for mother regarding PMADs if needed. Patient voices her mother will be staying with her until Sunday/Monday to assist in caring for self and baby. Patient discharged to go home with infant, pt's mother to transport home. Taken to visitor entrance via wheelchair by hospital volunteer.

## 2018-12-07 ENCOUNTER — Ambulatory Visit (INDEPENDENT_AMBULATORY_CARE_PROVIDER_SITE_OTHER): Payer: Commercial Managed Care - PPO | Admitting: Obstetrics and Gynecology

## 2018-12-07 ENCOUNTER — Encounter: Payer: Self-pay | Admitting: Obstetrics and Gynecology

## 2018-12-07 VITALS — BP 124/70 | Ht 64.0 in | Wt 200.0 lb

## 2018-12-07 DIAGNOSIS — Z98891 History of uterine scar from previous surgery: Secondary | ICD-10-CM

## 2018-12-07 NOTE — Progress Notes (Signed)
  OBSTETRICS POSTPARTUM CLINIC PROGRESS NOTE  Subjective:     Ariana Waters is a 33 y.o. [phone] female who presents for a postpartum visit. She is 1 week postpartum following a Term pregnancy and delivery by C-section.  I have fully reviewed the prenatal and intrapartum course. Anesthesia: spinal.  Postpartum course has been complicated by uncomplicated.  Baby is feeding by Bottle.  Bleeding: patient has not  resumed menses.  Bowel function is normal. Bladder function is normal.  Patient is not sexually active. Contraception method desired is undecided, reviewed LARCs with patient.  Postpartum depression screening: negative.  The following portions of the patient's history were reviewed and updated as appropriate: allergies, current medications, past family history, past medical history, past social history, past surgical history and problem list.  Review of Systems Pertinent items are noted in HPI.  Objective:    BP 124/70   Ht 5\' 4"  (1.626 m)   Wt 200 lb (90.7 kg)   Breastfeeding No   BMI 34.33 kg/m   General:  alert and no distress   Breasts:  inspection negative, no nipple discharge or bleeding, no masses or nodularity palpable  Lungs: clear to auscultation bilaterally  Heart:  regular rate and rhythm, S1, S2 normal, no murmur, click, rub or gallop  Abdomen: soft, non-tender; bowel sounds normal; no masses,  no organomegaly.   Well healed Pfannenstiel incision   Vulva:  normal  Vagina: normal vagina, no discharge, exudate, lesion, or erythema  Cervix:  no cervical motion tenderness and no lesions  Corpus: normal size, contour, position, consistency, mobility, non-tender  Adnexa:  normal adnexa and no mass, fullness, tenderness  Rectal Exam: Not performed.          Assessment:  Post Partum Care visit There are no diagnoses linked to this encounter.  Plan:  See orders and Patient Instructions  Will need to repeat pap at next visit for hx LGSIL pap in 2019.    Follow up in: 4-5 weeks or as needed.   Adelene Idler MD [facility], [facility] 12/07/2018  9:30 AM

## 2019-01-05 ENCOUNTER — Telehealth: Payer: Self-pay | Admitting: Obstetrics and Gynecology

## 2019-01-05 NOTE — Telephone Encounter (Signed)
2/20 AT 210 WITH cs FOR NEXPLANON INSERT

## 2019-01-14 ENCOUNTER — Other Ambulatory Visit (HOSPITAL_COMMUNITY)
Admission: RE | Admit: 2019-01-14 | Discharge: 2019-01-14 | Disposition: A | Discharge status: Home / Self Care | Payer: Commercial Managed Care - PPO | Source: Ambulatory Visit | Attending: Obstetrics and Gynecology | Admitting: Obstetrics and Gynecology

## 2019-01-14 ENCOUNTER — Encounter: Payer: Self-pay | Admitting: Obstetrics and Gynecology

## 2019-01-14 ENCOUNTER — Ambulatory Visit (INDEPENDENT_AMBULATORY_CARE_PROVIDER_SITE_OTHER): Payer: Commercial Managed Care - PPO | Admitting: Obstetrics and Gynecology

## 2019-01-14 VITALS — BP 110/64 | HR 64 | Ht 64.0 in | Wt 190.0 lb

## 2019-01-14 DIAGNOSIS — Z124 Encounter for screening for malignant neoplasm of cervix: Secondary | ICD-10-CM

## 2019-01-14 DIAGNOSIS — Z3046 Encounter for surveillance of implantable subdermal contraceptive: Secondary | ICD-10-CM

## 2019-01-14 DIAGNOSIS — Z30017 Encounter for initial prescription of implantable subdermal contraceptive: Secondary | ICD-10-CM

## 2019-01-14 MED ORDER — ETONOGESTREL 68 MG ~~LOC~~ IMPL: 68.0000 mg | DRUG_IMPLANT | Freq: Once | SUBCUTANEOUS | Status: AC

## 2019-01-14 NOTE — Patient Instructions (Signed)
Nexplanon Instructions After Insertion   Keep bandage clean and dry for 24 hours   May use ice/Tylenol/Ibuprofen for soreness or pain   If you develop fever, drainage or increased warmth from incision site-contact office immediately Etonogestrel implant What is this medicine? ETONOGESTREL (et oh noe JES trel) is a contraceptive (birth control) device. It is used to prevent pregnancy. It can be used for up to 3 years. This medicine may be used for other purposes; ask your health care provider or pharmacist if you have questions. COMMON BRAND NAME(S): Implanon, Nexplanon What should I tell my health care provider before I take this medicine? They need to know if you have any of these conditions: -abnormal vaginal bleeding -blood vessel disease or blood clots -breast, cervical, endometrial, ovarian, liver, or uterine cancer -diabetes -gallbladder disease -heart disease or recent heart attack -high blood pressure -high cholesterol or triglycerides -kidney disease -liver disease -migraine headaches -seizures -stroke -tobacco smoker -an unusual or allergic reaction to etonogestrel, anesthetics or antiseptics, other medicines, foods, dyes, or preservatives -pregnant or trying to get pregnant -breast-feeding How should I use this medicine? This device is inserted just under the skin on the inner side of your upper arm by a health care professional. Talk to your pediatrician regarding the use of this medicine in children. Special care may be needed. Overdosage: If you think you have taken too much of this medicine contact a poison control center or emergency room at once. NOTE: This medicine is only for you. Do not share this medicine with others. What if I miss a dose? This does not apply. What may interact with this medicine? Do not take this medicine with any of the following medications: -amprenavir -fosamprenavir This medicine may also interact with the following  medications: -acitretin -aprepitant -armodafinil -bexarotene -bosentan -carbamazepine -certain medicines for fungal infections like fluconazole, ketoconazole, itraconazole and voriconazole -certain medicines to treat hepatitis, HIV or AIDS -cyclosporine -felbamate -griseofulvin -lamotrigine -modafinil -oxcarbazepine -phenobarbital -phenytoin -primidone -rifabutin -rifampin -rifapentine -St. John's wort -topiramate This list may not describe all possible interactions. Give your health care provider a list of all the medicines, herbs, non-prescription drugs, or dietary supplements you use. Also tell them if you smoke, drink alcohol, or use illegal drugs. Some items may interact with your medicine. What should I watch for while using this medicine? This product does not protect you against HIV infection (AIDS) or other sexually transmitted diseases. You should be able to feel the implant by pressing your fingertips over the skin where it was inserted. Contact your doctor if you cannot feel the implant, and use a non-hormonal birth control method (such as condoms) until your doctor confirms that the implant is in place. Contact your doctor if you think that the implant may have broken or become bent while in your arm. You will receive a user card from your health care provider after the implant is inserted. The card is a record of the location of the implant in your upper arm and when it should be removed. Keep this card with your health records. What side effects may I notice from receiving this medicine? Side effects that you should report to your doctor or health care professional as soon as possible: -allergic reactions like skin rash, itching or hives, swelling of the face, lips, or tongue -breast lumps, breast tissue changes, or discharge -breathing problems -changes in emotions or moods -if you feel that the implant may have broken or bent while in your arm -high blood    pressure -pain, irritation, swelling, or bruising at the insertion site -scar at site of insertion -signs of infection at the insertion site such as fever, and skin redness, pain or discharge -signs and symptoms of a blood clot such as breathing problems; changes in vision; chest pain; severe, sudden headache; pain, swelling, warmth in the leg; trouble speaking; sudden numbness or weakness of the face, arm or leg -signs and symptoms of liver injury like dark yellow or brown urine; general ill feeling or flu-like symptoms; light-colored stools; loss of appetite; nausea; right upper belly pain; unusually weak or tired; yellowing of the eyes or skin -unusual vaginal bleeding, discharge Side effects that usually do not require medical attention (report to your doctor or health care professional if they continue or are bothersome): -acne -breast pain or tenderness -headache -irregular menstrual bleeding -nausea This list may not describe all possible side effects. Call your doctor for medical advice about side effects. You may report side effects to FDA at 1-800-FDA-1088. Where should I keep my medicine? This drug is given in a hospital or clinic and will not be stored at home. NOTE: This sheet is a summary. It may not cover all possible information. If you have questions about this medicine, talk to your doctor, pharmacist, or health care provider.  2019 Elsevier/Gold Standard (2017-09-30 14:11:42)   

## 2019-01-14 NOTE — Progress Notes (Signed)
   Nexplanon Insertion  Patient given informed consent, signed copy in the chart, time out was performed. Appropriate time out taken.  Patient's left arm was prepped and draped in the usual sterile fashion.. The ruler used to measure and mark insertion area.  Pt was prepped with betadine swab and then injected with 5 cc of 2% lidocaine with epinephrine. Nexplanon removed form packaging,  Device confirmed in needle, then inserted full length of needle and withdrawn per handbook instructions.  Pt insertion site covered with steri-strip and a bandage.   Minimal blood loss.  Pt tolerated the procedure well.   Pap smear collected today as well.  Adelene Idler MD [facility], [facility] 01/14/2019  2:52 PM

## 2019-01-20 LAB — CYTOLOGY - PAP
HPV 16/18/45 genotyping: NEGATIVE
HPV: DETECTED — AB

## 2019-01-20 NOTE — Progress Notes (Signed)
Called and discussed with patient. She will need a colposcopy. She was transefered to Maralyn Sago to schedule this.

## 2019-02-02 ENCOUNTER — Ambulatory Visit: Payer: Commercial Managed Care - PPO | Admitting: Obstetrics and Gynecology

## 2019-02-16 ENCOUNTER — Telehealth: Payer: Self-pay | Admitting: Obstetrics and Gynecology

## 2019-02-16 NOTE — Telephone Encounter (Signed)
Thank you :)

## 2019-02-16 NOTE — Telephone Encounter (Signed)
Pt has MyChart you would sent a note to her that way or you could write one and I will print it for her

## 2019-02-16 NOTE — Telephone Encounter (Signed)
Is she keeping her appointment tomorrow? I can do the note then

## 2019-02-16 NOTE — Telephone Encounter (Signed)
Yes ma'am I will let her know

## 2019-02-16 NOTE — Telephone Encounter (Signed)
Pt needs note to return to work at [employer] after C/S 12/01/18. Supposed to return 02/22/19, delivered by Dr. Jerene Pitch. Fax note to attn: Merry Proud Fax # [phone]

## 2019-02-17 ENCOUNTER — Ambulatory Visit: Payer: Commercial Managed Care - PPO | Admitting: Obstetrics and Gynecology

## 2019-02-17 ENCOUNTER — Encounter: Payer: Self-pay | Admitting: Obstetrics and Gynecology

## 2019-02-17 NOTE — Telephone Encounter (Signed)
Patient noshowed schedule appointment for today has been reschedule to tomorrow

## 2019-02-17 NOTE — Telephone Encounter (Signed)
Patient is calling to find out if note has been Faxed. 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 Please advise and let patient know.  Thank you!

## 2019-02-18 ENCOUNTER — Encounter: Payer: Self-pay | Admitting: Obstetrics and Gynecology

## 2019-02-18 ENCOUNTER — Ambulatory Visit: Payer: Commercial Managed Care - PPO | Admitting: Obstetrics and Gynecology

## 2019-02-18 ENCOUNTER — Other Ambulatory Visit (HOSPITAL_COMMUNITY)
Admission: RE | Admit: 2019-02-18 | Discharge: 2019-02-18 | Disposition: A | Discharge status: Home / Self Care | Payer: Commercial Managed Care - PPO | Source: Ambulatory Visit | Attending: Obstetrics and Gynecology | Admitting: Obstetrics and Gynecology

## 2019-02-18 ENCOUNTER — Other Ambulatory Visit: Payer: Self-pay

## 2019-02-18 VITALS — BP 112/60 | Ht 64.0 in | Wt 184.0 lb

## 2019-02-18 DIAGNOSIS — R87612 Low grade squamous intraepithelial lesion on cytologic smear of cervix (LGSIL): Secondary | ICD-10-CM | POA: Diagnosis not present

## 2019-02-18 NOTE — Progress Notes (Signed)
   GYNECOLOGY CLINIC COLPOSCOPY PROCEDURE NOTE  33 y.o. [id number] here for colposcopy for low-grade squamous intraepithelial neoplasia (LGSIL - encompassing HPV,mild dysplasia,CIN I)  pap smear on 01/14/2019. Discussed underlying role for HPV infection in the development of cervical dysplasia, its natural history and progression/regression, need for surveillance.  Is the patient  pregnant: No LMP: No LMP recorded. Smoking status:  reports that she has never smoked. She has never used smokeless tobacco. Contraception: Nexplanon Future fertility desired:  Yes  Patient given informed consent, signed copy in the chart, time out was performed.  The patient was position in dorsal lithotomy position. Speculum was placed the cervix was visualized.   After application of acetic acid colposcopic inspection of the cervix was undertaken.   Colposcopy adequate, full visualization of transformation zone: Yes acetowhite lesion(s) noted at 9,6,2  o'clock; corresponding biopsies obtained.   ECC specimen obtained:  Yes  All specimens were labeled and sent to pathology.   Patient was given post procedure instructions.  Will follow up pathology and manage accordingly.  Routine preventative health maintenance measures emphasized.  Physical Exam Genitourinary:        Genitourinary Comments: Biopsy at 9, 6, 2 o'clock Small vessels at 9 o'clock     Adelene Idler MD [facility], [facility] 02/18/2019 9:43 AM

## 2019-09-10 ENCOUNTER — Ambulatory Visit (INDEPENDENT_AMBULATORY_CARE_PROVIDER_SITE_OTHER): Payer: Commercial Managed Care - PPO | Admitting: Obstetrics and Gynecology

## 2019-09-10 ENCOUNTER — Other Ambulatory Visit: Payer: Self-pay

## 2019-09-10 ENCOUNTER — Encounter: Payer: Self-pay | Admitting: Obstetrics and Gynecology

## 2019-09-10 VITALS — BP 110/70 | HR 86 | Ht 64.0 in | Wt 204.0 lb

## 2019-09-10 DIAGNOSIS — Z3049 Encounter for surveillance of other contraceptives: Secondary | ICD-10-CM

## 2019-09-10 DIAGNOSIS — Z3046 Encounter for surveillance of implantable subdermal contraceptive: Secondary | ICD-10-CM

## 2019-09-10 NOTE — Patient Instructions (Signed)
Loop Electrosurgical Excision Procedure Loop electrosurgical excision procedure (LEEP) is the cutting and removal (excision) of tissue from the cervix. The cervix is the bottom part of the uterus that opens into the vagina. The tissue that is removed from the cervix is examined to see if there are precancerous cells or cancer cells present. LEEP may be done when:  You have abnormal bleeding from your cervix.  You have an abnormal Pap test result.  Your health care provider finds an abnormality on your cervix during a pelvic exam. LEEP typically only takes a few minutes and is often done in the health care provider's office. The procedure is safe for women who are trying to get pregnant. However, the procedure is usually not done during a menstrual period or during pregnancy. Tell a health care provider about:  Any allergies you have.  All medicines you are taking, including vitamins, herbs, eye drops, creams, and over-the-counter medicines.  Any blood disorders you have.  Any medical conditions you have, including current or past vaginal infections such as herpes or sexually-transmitted infections (STIs).  Whether you are pregnant or may be pregnant.  Whether or not you are having vaginal bleeding on the day of the procedure. What are the risks? Generally, this is a safe procedure. However, problems may occur, including:  Infection.  Bleeding.  Allergic reactions to medicines.  Changes or scarring in the cervix.  Increased risk of early (preterm) labor in future pregnancies. What happens before the procedure?  Ask your health care provider about: ? Changing or stopping your regular medicines. This is especially important if you are taking diabetes medicines or blood thinners. ? Taking medicines such as aspirin and ibuprofen. These medicines can thin your blood. Do not take these medicines unless your health care provider tells you to take them. ? Taking over-the-counter  medicines, vitamins, herbs, and supplements.  Your health care provider may recommend that you take pain medicine before the procedure.  Ask your health care provider if you should plan to have someone take you home after the procedure. What happens during the procedure?   An instrument called a speculum will be placed in your vagina. This will allow your health care provider to see your cervix.  You will be given a medicine to numb the area (local anesthetic). The medicine will be injected into your cervix and the surrounding area.  A solution will be applied to your cervix. This solution will help the health care provider find the abnormal cells that need to be removed.  A thin wire loop will be passed through your vagina. The wire will be used to burn (cauterize) the cervical tissue with an electrical current.  You may feel faint during the procedure. Tell your health care provider right away if you feel this way.  The abnormal cervical tissue will be removed.  Any open blood vessels will be cauterized to prevent bleeding.  A paste may be applied to the cauterized area of your cervix to help prevent bleeding.  The sample of cervical tissue will be examined under a microscope. The procedure may vary among health care providers and hospitals. What can I expect after the procedure? After the procedure, it is common to have:  Mild abdominal cramps that are similar to menstrual cramps. These may last for up to 1 week.  A small amount of pink-tinged or bloody vaginal discharge, including light to moderate bleeding, for 1-2 weeks.  A dark-colored discharge coming from your vagina. This is from   the paste that was used on the cervix to prevent bleeding. It is up to you to get the results of your procedure. Ask your health care provider, or the department that is doing the procedure, when your results will be ready. Follow these instructions at home:  Take over-the-counter and  prescription medicines only as told by your health care provider.  Return to your normal activities as told by your health care provider. Ask your health care provider what activities are safe for you.  Do not put anything in your vagina for 2 weeks after the procedure or until your health care provider says that it is okay. This includes tampons, creams, and douches.  Do not have sex until your health care provider approves.  Keep all follow-up visits as told by your health care provider. This is important. Contact a health care provider if you:  Have a fever or chills.  Feel unusually weak.  Have vaginal bleeding that is heavier or longer than a normal menstrual cycle. A sign of this can be soaking a pad with blood or bleeding with clots.  Develop a bad smelling vaginal discharge.  Have severe abdominal pain or cramping. Summary  Loop electrosurgical excision procedure (LEEP) is the removal of tissue from the cervix. The removed tissue will be checked for precancerous cells or cancer cells.  LEEP typically only takes a few minutes and is often done in the health care provider's office.  Do not put anything in your vagina for 2 weeks after the procedure or until your health care provider says that it is okay. This includes tampons, creams, and douches.  Keep all follow-up visits as told by your health care provider. Ask your health care provider, or the department that is doing the procedure, when your results will be ready. This information is not intended to replace advice given to you by your health care provider. Make sure you discuss any questions you have with your health care provider. Document Released: 02/01/2003 Document Revised: 12/04/2018 Document Reviewed: 12/04/2018 Elsevier Patient Education  2020 Elsevier Inc.  

## 2019-09-13 ENCOUNTER — Telehealth: Payer: Self-pay | Admitting: Obstetrics and Gynecology

## 2019-09-13 ENCOUNTER — Encounter: Payer: Self-pay | Admitting: Obstetrics and Gynecology

## 2019-09-13 NOTE — Telephone Encounter (Signed)
Called patient to discuss LEEP or cervical conization, went straight to voicemail. Left message to return call.  Adrian Prows MD [facility], [facility] 09/13/2019 1:00 PM

## 2019-09-13 NOTE — Progress Notes (Signed)
  Nexplanon removal Procedure note - The Nexplanon was noted in the patient's arm and the end was identified. The skin was cleansed with a Betadine solution. A small injection of subcutaneous lidocaine with epinephrine was given over the end of the implant. An incision was made at the end of the implant. The rod was noted in the incision and grasped with a hemostat. It was noted to be intact.  Steri-Strip was placed approximating the incision. Hemostasis was noted.  Discussed with patient colposcopy CIN 2-3 result and options for treatment of this with LEEP or Cervical conization. She will follow up for further care.    Homero Fellers ,MD 09/13/2019,7:43 AM

## 2019-09-14 ENCOUNTER — Telehealth: Payer: Self-pay | Admitting: Obstetrics and Gynecology

## 2019-09-14 ENCOUNTER — Other Ambulatory Visit: Payer: Self-pay | Admitting: Obstetrics and Gynecology

## 2019-09-14 NOTE — Telephone Encounter (Signed)
Called patient regarding colposcopy and continued conversation about LEEP.  Left generic message for patient asking her to return call.   Adrian Prows MD [facility], [facility] 09/14/2019 12:32 PM

## 2020-06-16 ENCOUNTER — Ambulatory Visit: Payer: Commercial Managed Care - PPO | Admitting: Advanced Practice Midwife

## 2021-01-29 NOTE — Progress Notes (Signed)
Routine Prenatal Care Visit  Delayed chart closure.  Subjective  Ariana Waters is a 35 y.o. [id number] at [redacted]w[redacted]d being seen today for ongoing prenatal care.  She is currently monitored for the following issues for this low-risk pregnancy and has Atypical squamous cell changes of undetermined significance (ASCUS) on cervical cytology with positive high risk human papilloma virus (HPV); History of abnormal cervical Pap smear; Hiatal hernia; LGSIL on Pap smear of cervix; Hx of abdominoplasty; Obesity (BMI 30.0-34.9); Supervision of high risk pregnancy, antepartum; Weight loss; Obesity affecting pregnancy; BMI 30.0-30.9,adult; Status post cesarean delivery; and [redacted] weeks gestation of pregnancy on their problem list.  ----------------------------------------------------------------------------------- Patient reports no complaints.   Reports normal fetal movement. Denies contractions. Denies vaginal bleeding.  Denies leaking of fluid.  ----------------------------------------------------------------------------------- The following portions of the patient's history were reviewed and updated as appropriate: allergies, current medications, past family history, past medical history, past social history, past surgical history and problem list. Problem list updated.   Objective  Blood pressure 114/72, weight 205 lb (93 kg), last menstrual period 03/03/2018, not currently breastfeeding. Pregravid weight 176 lb (79.8 kg) Total Weight Gain 29 lb (13.2 kg) Urinalysis:      Fetal Status: heart tones present  General:  Alert, oriented and cooperative. Patient is in no acute distress.  Skin: Skin is warm and dry. No rash noted.   Cardiovascular: Normal heart rate noted  Respiratory: Normal respiratory effort, no problems with respiration noted  Abdomen: Soft, gravid, appropriate for gestational age.       Pelvic:  Cervical exam deferred        Extremities: Normal range of motion.     Mental Status:  Normal mood and affect. Normal behavior. Normal judgment and thought content.     Assessment   35 y.o. [id number] at [redacted]w[redacted]d by  12/08/2018, by Last Menstrual Period presenting for routine prenatal visit  Plan   pregnancy #2 Problems (from 04/21/18 to 12/03/18)    Problem Noted Resolved   Status post cesarean delivery 10/30/2018 by Natale Milch, MD No   Obesity affecting pregnancy 07/01/2018 by Conard Novak, MD No   BMI 30.0-30.9,adult 07/01/2018 by Conard Novak, MD No   Supervision of high risk pregnancy, antepartum 04/24/2018 by Farrel Conners, CNM No   Overview Addendum 11/19/2018  3:11 PM by Farrel Conners, CNM    [facility]inic [facility] Prenatal Labs  Dating  LMP=7wk [place] Blood type: A/Positive/-- (05/28 1045)   Genetic Screen 1 Screen:  normal Antibody:Negative (05/28 1045)  Anatomic [place]  incomplete Rubella: 1.15 (05/28 1045)  Varicella: VI  GTT Early: 97 Third trimester:  RPR: Non Reactive (05/28 1045)   Rhogam Not applicable HBsAg: Negative (05/28 1045) neg  TDaP vaccine      10/02/18                  Flu Shot: HIV: Non Reactive (05/28 1045)   Baby Food      bottle                          GBS: negative  Contraception  Liletta Pap: 2019 LSIL HPV +  CBB   AA hemoglobin  CS/VBAC  cesarean   Support Person           Previous Version   Hx of abdominoplasty 04/21/2018 by Farrel Conners, CNM No   Obesity (BMI 30.0-34.9) 04/21/2018 by Farrel Conners, CNM No  Gestational age appropriate obstetric precautions including but not limited to vaginal bleeding, contractions, leaking of fluid and fetal movement were reviewed in detail with the patient.    Return in about 2 weeks (around 09/18/2018) for should have appt scheduled already for 1GTT and ROB.  Natale Milch MD [facility], [facility] 01/29/2021, 9:32 AM

## 2021-10-05 ENCOUNTER — Ambulatory Visit: Payer: Medicaid Other | Admitting: Advanced Practice Midwife

## 2021-10-05 ENCOUNTER — Other Ambulatory Visit: Payer: Self-pay

## 2021-10-30 NOTE — Telephone Encounter (Signed)
Nexplanon rcvd 01/14/2019

## 2022-05-20 ENCOUNTER — Other Ambulatory Visit (HOSPITAL_COMMUNITY)
Admission: RE | Admit: 2022-05-20 | Discharge: 2022-05-20 | Disposition: A | Discharge status: Home / Self Care | Payer: Medicaid Other | Source: Ambulatory Visit | Attending: Obstetrics & Gynecology | Admitting: Obstetrics & Gynecology

## 2022-05-20 ENCOUNTER — Ambulatory Visit (INDEPENDENT_AMBULATORY_CARE_PROVIDER_SITE_OTHER): Payer: Medicaid Other | Admitting: Obstetrics & Gynecology

## 2022-05-20 ENCOUNTER — Encounter: Payer: Self-pay | Admitting: Obstetrics & Gynecology

## 2022-05-20 VITALS — BP 100/70 | Ht 67.0 in | Wt 215.0 lb

## 2022-05-20 DIAGNOSIS — Z01419 Encounter for gynecological examination (general) (routine) without abnormal findings: Secondary | ICD-10-CM | POA: Insufficient documentation

## 2022-05-20 DIAGNOSIS — Z113 Encounter for screening for infections with a predominantly sexual mode of transmission: Secondary | ICD-10-CM | POA: Diagnosis not present

## 2022-05-20 DIAGNOSIS — N898 Other specified noninflammatory disorders of vagina: Secondary | ICD-10-CM | POA: Insufficient documentation

## 2022-05-20 DIAGNOSIS — Z124 Encounter for screening for malignant neoplasm of cervix: Secondary | ICD-10-CM | POA: Diagnosis not present

## 2022-05-20 DIAGNOSIS — Z0000 Encounter for general adult medical examination without abnormal findings: Secondary | ICD-10-CM | POA: Diagnosis not present

## 2022-05-23 LAB — CYTOLOGY - PAP
Chlamydia: NEGATIVE
Comment: NEGATIVE
Comment: NEGATIVE
Comment: NORMAL
Diagnosis: NEGATIVE
High risk HPV: NEGATIVE
Neisseria Gonorrhea: NEGATIVE

## 2022-05-27 ENCOUNTER — Other Ambulatory Visit: Payer: Medicaid Other

## 2022-05-29 ENCOUNTER — Encounter: Payer: Self-pay | Admitting: Obstetrics & Gynecology

## 2022-06-03 ENCOUNTER — Other Ambulatory Visit: Payer: Medicaid Other

## 2022-06-04 ENCOUNTER — Encounter: Payer: Self-pay | Admitting: Obstetrics & Gynecology

## 2022-06-04 LAB — SYPHILIS: RPR W/REFLEX TO RPR TITER AND TREPONEMAL ANTIBODIES, TRADITIONAL SCREENING AND DIAGNOSIS ALGORITHM: RPR Ser Ql: NONREACTIVE

## 2022-06-04 LAB — CBC
Hematocrit: 32.5 % — ABNORMAL LOW (ref 34.0–46.6)
Hemoglobin: 9.8 g/dL — ABNORMAL LOW (ref 11.1–15.9)
MCH: 25.3 pg — ABNORMAL LOW (ref 26.6–33.0)
MCHC: 30.2 g/dL — ABNORMAL LOW (ref 31.5–35.7)
MCV: 84 fL (ref 79–97)
Platelets: 296 x10E3/uL (ref 150–450)
RBC: 3.87 x10E6/uL (ref 3.77–5.28)
RDW: 14.7 % (ref 11.7–15.4)
WBC: 5.5 x10E3/uL (ref 3.4–10.8)

## 2022-06-04 LAB — TSH+FREE T4
Free T4: 1.04 ng/dL (ref 0.82–1.77)
TSH: 2.48 u[IU]/mL (ref 0.450–4.500)

## 2022-06-04 LAB — HEPATITIS C ANTIBODY: Hep C Virus Ab: NONREACTIVE

## 2022-06-04 LAB — COMPREHENSIVE METABOLIC PANEL
ALT: 11 IU/L (ref 0–32)
AST: 20 IU/L (ref 0–40)
Albumin/Globulin Ratio: 1.2 (ref 1.2–2.2)
Albumin: 3.6 g/dL — ABNORMAL LOW (ref 3.9–4.9)
Alkaline Phosphatase: 66 IU/L (ref 44–121)
BUN/Creatinine Ratio: 8 — ABNORMAL LOW (ref 9–23)
BUN: 6 mg/dL (ref 6–20)
Bilirubin Total: 0.7 mg/dL (ref 0.0–1.2)
CO2: 22 mmol/L (ref 20–29)
Calcium: 8.6 mg/dL — ABNORMAL LOW (ref 8.7–10.2)
Chloride: 106 mmol/L (ref 96–106)
Creatinine, Ser: 0.76 mg/dL (ref 0.57–1.00)
Globulin, Total: 2.9 g/dL (ref 1.5–4.5)
Glucose: 84 mg/dL (ref 70–99)
Potassium: 4.3 mmol/L (ref 3.5–5.2)
Sodium: 138 mmol/L (ref 134–144)
Total Protein: 6.5 g/dL (ref 6.0–8.5)
eGFR: 105 mL/min/{1.73_m2} (ref >59)

## 2022-06-04 LAB — LIPID PANEL
Chol/HDL Ratio: 2.9 ratio (ref 0.0–4.4)
Cholesterol, Total: 160 mg/dL (ref 100–199)
HDL: 55 mg/dL
LDL Chol Calc (NIH): 95 mg/dL (ref 0–99)
Triglycerides: 50 mg/dL (ref 0–149)
VLDL Cholesterol Cal: 10 mg/dL (ref 5–40)

## 2022-06-04 LAB — HIV ANTIBODY (ROUTINE TESTING W REFLEX): HIV Screen 4th Generation wRfx: NONREACTIVE

## 2022-06-04 LAB — HEPATITIS B SURFACE ANTIGEN: Hepatitis B Surface Ag: NEGATIVE

## 2022-10-03 ENCOUNTER — Ambulatory Visit: Payer: Commercial Managed Care - PPO | Admitting: Family Medicine

## 2022-11-04 ENCOUNTER — Telehealth: Payer: Self-pay

## 2022-11-04 NOTE — Telephone Encounter (Signed)
[facility] called. Labs were not covered with codes used. They would like more codes to see if labs will be covered.

## 2023-01-13 DIAGNOSIS — F4325 Adjustment disorder with mixed disturbance of emotions and conduct: Secondary | ICD-10-CM | POA: Diagnosis not present

## 2023-02-12 DIAGNOSIS — F4323 Adjustment disorder with mixed anxiety and depressed mood: Secondary | ICD-10-CM | POA: Diagnosis not present

## 2023-02-24 ENCOUNTER — Ambulatory Visit (INDEPENDENT_AMBULATORY_CARE_PROVIDER_SITE_OTHER): Payer: BC Managed Care – PPO | Admitting: Family Medicine

## 2023-02-24 ENCOUNTER — Encounter: Payer: Self-pay | Admitting: Family Medicine

## 2023-02-24 VITALS — BP 112/82 | HR 70 | Temp 98.9°F | Ht 66.0 in | Wt 216.2 lb

## 2023-02-24 DIAGNOSIS — L299 Pruritus, unspecified: Secondary | ICD-10-CM | POA: Diagnosis not present

## 2023-02-24 DIAGNOSIS — Z7689 Persons encountering health services in other specified circumstances: Secondary | ICD-10-CM

## 2023-02-24 NOTE — Patient Instructions (Signed)
It was nice meeting you today.  I have included some information about itching and eczema for you to review.

## 2023-02-24 NOTE — Progress Notes (Signed)
   Established Patient Office Visit   Subjective  Patient ID: Ariana Waters, female    DOB: 06-17-1986  Age: 37 y.o. MRN: [id number]  Chief Complaint  Patient presents with   Establish Care    Pt is a 37 yo female with no sig pmh who presents for est care.  Pt states she is overall healthy.  Seen by [facility] yearly.  Notes occasional pruritus of ears.  Denies h/o seasonal allergies.  Allergies: PCN- hives, facial Waters Ariana Waters  Social hx: Pt is single.  She has 2 kids ages 75 and 85.  Pt working as an Loss adjuster, chartered.  Previoiusly worked in health care.  Pt denies EtOH, tobacco , or drug use.  Family Med Hx: -family overall healthy Pts son has seasonal allergies Sister-eczema Maternal great aunt-breast cancer.      ROS Negative unless stated above    Objective:     BP 112/82 (BP Location: Right Arm, Cuff Size: Large)   Pulse 70   Temp 98.9 F (37.2 C) (Oral)   Ht 5\' 6"  (1.676 m)   Wt 216 lb 3.2 oz (98.1 kg)   LMP 02/24/2023 (Exact Date)   SpO2 99%   BMI 34.90 kg/m    Physical Exam Constitutional:      General: She is not in acute distress.    Appearance: Normal appearance.  HENT:     Head: Normocephalic and atraumatic.     Comments: Dried skin of bilateral external ears and canals.    Nose: Nose normal.     Mouth/Throat:     Mouth: Mucous membranes are moist.  Cardiovascular:     Rate and Rhythm: Normal rate and regular rhythm.     Heart sounds: Normal heart sounds. No murmur heard.    No gallop.  Pulmonary:     Effort: Pulmonary effort is normal. No respiratory distress.     Breath sounds: Normal breath sounds. No wheezing, rhonchi or rales.  Skin:    General: Skin is warm and dry.     Comments: Allergic nasal crease  Neurological:     Mental Status: She is alert and oriented to person, place, and time.      No results found for any visits on 02/24/23.    Assessment & Plan:  Pruritus -Of bilateral ear canals and external  ears likely 2/2 dry skin. -Also discussed possibly related to seasonal allergies or eczema. -discussed applying a gentle moisturizer and using unscented products. -Consider OTC antihistamine.  Encounter to establish care -We reviewed the PMH, PSH, FH, SH, Meds and Allergies. -We provided refills for any medications we will prescribe as needed. -We addressed current concerns per orders and patient instructions. -We have asked for records for pertinent exams, studies, vaccines and notes from previous providers. -We have advised patient to follow up per instructions below.   Return if symptoms worsen or fail to improve.   Billie Ruddy, MD

## 2023-03-03 DIAGNOSIS — F4323 Adjustment disorder with mixed anxiety and depressed mood: Secondary | ICD-10-CM | POA: Diagnosis not present

## 2023-03-17 DIAGNOSIS — F4323 Adjustment disorder with mixed anxiety and depressed mood: Secondary | ICD-10-CM | POA: Diagnosis not present

## 2023-03-31 DIAGNOSIS — F4323 Adjustment disorder with mixed anxiety and depressed mood: Secondary | ICD-10-CM | POA: Diagnosis not present

## 2023-04-07 DIAGNOSIS — F4323 Adjustment disorder with mixed anxiety and depressed mood: Secondary | ICD-10-CM | POA: Diagnosis not present

## 2023-07-15 DIAGNOSIS — U071 COVID-19: Secondary | ICD-10-CM | POA: Diagnosis not present

## 2023-07-15 DIAGNOSIS — R519 Headache, unspecified: Secondary | ICD-10-CM | POA: Diagnosis not present

## 2023-07-15 DIAGNOSIS — R5383 Other fatigue: Secondary | ICD-10-CM | POA: Diagnosis not present

## 2023-09-11 ENCOUNTER — Telehealth: Payer: Self-pay

## 2023-09-11 NOTE — Telephone Encounter (Signed)
Patient inquiring about her blood type. She is unable to locate in my chart. Advised A Positive.

## 2024-02-20 ENCOUNTER — Encounter: Payer: Self-pay | Admitting: Family Medicine

## 2024-02-20 ENCOUNTER — Ambulatory Visit (INDEPENDENT_AMBULATORY_CARE_PROVIDER_SITE_OTHER): Payer: BC Managed Care – PPO | Admitting: Family Medicine

## 2024-02-20 VITALS — BP 118/76 | HR 87 | Resp 20 | Ht 64.57 in | Wt 169.4 lb

## 2024-02-20 DIAGNOSIS — Z113 Encounter for screening for infections with a predominantly sexual mode of transmission: Secondary | ICD-10-CM | POA: Diagnosis not present

## 2024-02-20 DIAGNOSIS — Z131 Encounter for screening for diabetes mellitus: Secondary | ICD-10-CM | POA: Diagnosis not present

## 2024-02-20 DIAGNOSIS — Z0000 Encounter for general adult medical examination without abnormal findings: Secondary | ICD-10-CM

## 2024-02-20 LAB — CBC WITH DIFFERENTIAL/PLATELET
Basophils Absolute: 0 10*3/uL (ref 0.0–0.1)
Basophils Relative: 0.9 % (ref 0.0–3.0)
Eosinophils Absolute: 0.2 10*3/uL (ref 0.0–0.7)
Eosinophils Relative: 3.8 % (ref 0.0–5.0)
HCT: 39.5 % (ref 36.0–46.0)
Hemoglobin: 13 g/dL (ref 12.0–15.0)
Lymphocytes Relative: 33.3 % (ref 12.0–46.0)
Lymphs Abs: 1.7 10*3/uL (ref 0.7–4.0)
MCHC: 32.8 g/dL (ref 30.0–36.0)
MCV: 91 fl (ref 78.0–100.0)
Monocytes Absolute: 0.3 10*3/uL (ref 0.1–1.0)
Monocytes Relative: 5.3 % (ref 3.0–12.0)
Neutro Abs: 2.9 10*3/uL (ref 1.4–7.7)
Neutrophils Relative %: 56.7 % (ref 43.0–77.0)
Platelets: 278 10*3/uL (ref 150.0–400.0)
RBC: 4.34 Mil/uL (ref 3.87–5.11)
RDW: 13.8 % (ref 11.5–15.5)
WBC: 5 10*3/uL (ref 4.0–10.5)

## 2024-02-20 LAB — TSH: TSH: 1.9 u[IU]/mL (ref 0.35–5.50)

## 2024-02-20 LAB — LIPID PANEL
Cholesterol: 158 mg/dL (ref 0–200)
HDL: 64.5 mg/dL (ref >39.00)
LDL Cholesterol: 83 mg/dL (ref 0–99)
NonHDL: 93.48
Total CHOL/HDL Ratio: 2
Triglycerides: 50 mg/dL (ref 0.0–149.0)
VLDL: 10 mg/dL (ref 0.0–40.0)

## 2024-02-20 LAB — COMPREHENSIVE METABOLIC PANEL WITH GFR
ALT: 16 U/L (ref 0–35)
AST: 21 U/L (ref 0–37)
Albumin: 4.1 g/dL (ref 3.5–5.2)
Alkaline Phosphatase: 37 U/L — ABNORMAL LOW (ref 39–117)
BUN: 8 mg/dL (ref 6–23)
CO2: 27 meq/L (ref 19–32)
Calcium: 9 mg/dL (ref 8.4–10.5)
Chloride: 104 meq/L (ref 96–112)
Creatinine, Ser: 0.74 mg/dL (ref 0.40–1.20)
GFR: 103.9 mL/min (ref >60.00)
Glucose, Bld: 74 mg/dL (ref 70–99)
Potassium: 3.9 meq/L (ref 3.5–5.1)
Sodium: 137 meq/L (ref 135–145)
Total Bilirubin: 1.5 mg/dL — ABNORMAL HIGH (ref 0.2–1.2)
Total Protein: 7.6 g/dL (ref 6.0–8.3)

## 2024-02-20 LAB — T4, FREE: Free T4: 0.84 ng/dL (ref 0.60–1.60)

## 2024-02-20 NOTE — Patient Instructions (Signed)
 Your last Pap was done 05/23/2022.  Your next one would be due in 2028, sooner if you are having any issues.  Mammograms will start at age 37 unless you have a family history of someone having breast cancer at a young age.

## 2024-02-20 NOTE — Progress Notes (Signed)
 Established Patient Office Visit   Subjective  Patient ID: Ariana Waters, female    DOB: 03/18/1986  Age: 38 y.o. MRN: [id number]  Chief Complaint  Patient presents with   Annual Exam    Patient is a 38 year old female seen for CPE.  Patient is fasting.  Patient states she has been doing well overall.  Staying busy with work and kids.  Patient has no complaints/concerns.    Patient Active Problem List   Diagnosis Date Noted   BMI 30.0-30.9,adult 07/01/2018   Hx of abdominoplasty 04/21/2018   Obesity (BMI 30.0-34.9) 04/21/2018   LGSIL on Pap smear of cervix 01/07/2018   Atypical squamous cell changes of undetermined significance (ASCUS) on cervical cytology with positive high risk human papilloma virus (HPV) 11/04/2017   History of abnormal cervical Pap smear    Hiatal hernia    Weight loss 10/13/2014   Past Medical History:  Diagnosis Date   Hiatal hernia    History of abnormal cervical Pap smear 2010/ 2013   Obesity (BMI 30.0-34.9)    Previous cesarean section complicating pregnancy    Past Surgical History:  Procedure Laterality Date   ABDOMINOPLASTY  2017   CESAREAN SECTION  11/14/2006   CESAREAN SECTION N/A 12/01/2018   Procedure: CESAREAN SECTION;  Surgeon: Natale Milch, MD;  Location: [facility] ORS;  Service: Obstetrics;  Laterality: N/A;   COLPOSCOPY  ?2010   ESOPHAGOGASTRODUODENOSCOPY  01/2014   Social History   Tobacco Use   Smoking status: Never   Smokeless tobacco: Never  Vaping Use   Vaping status: Never Used  Substance Use Topics   Alcohol use: Yes    Comment: rare   Drug use: No   Family History  Problem Relation Age of Onset   Breast cancer Maternal Aunt 49   Cancer Paternal Aunt        unknown primary/ paternal great aunt   Thyroid disease Maternal Aunt    Stroke Neg Hx    Allergies  Allergen Reactions   Penicillins Hives and Swelling    DID THE REACTION INVOLVE: Swelling of the face/tongue/throat, SOB, or low BP? Yes Sudden  or severe rash/hives, skin peeling, or the inside of the mouth or nose? Unknown Did it require medical treatment? Yes When did it last happen?Occurred when patient was 38 years old If all above answers are "NO", may proceed with cephalosporin use.    Lemon Oil Rash      ROS Negative unless stated above    Objective:     BP 118/76   Pulse 87   Resp 20   Ht 5' 4.57" (1.64 m)   Wt 169 lb 6 oz (76.8 kg)   LMP 02/03/2024   SpO2 99%   BMI 28.56 kg/m  BP Readings from Last 3 Encounters:  02/20/24 118/76  02/24/23 112/82  05/20/22 100/70   Wt Readings from Last 3 Encounters:  02/20/24 169 lb 6 oz (76.8 kg)  02/24/23 216 lb 3.2 oz (98.1 kg)  05/20/22 215 lb (97.5 kg)      Physical Exam Constitutional:      Appearance: Normal appearance.  HENT:     Head: Normocephalic and atraumatic.     Right Ear: Tympanic membrane, ear canal and external ear normal.     Left Ear: Tympanic membrane, ear canal and external ear normal.     Nose: Nose normal.     Mouth/Throat:     Mouth: Mucous membranes are moist.  Pharynx: No oropharyngeal exudate or posterior oropharyngeal erythema.  Eyes:     General: No scleral icterus.    Extraocular Movements: Extraocular movements intact.     Conjunctiva/sclera: Conjunctivae normal.     Pupils: Pupils are equal, round, and reactive to light.  Neck:     Thyroid: No thyromegaly.  Cardiovascular:     Rate and Rhythm: Normal rate and regular rhythm.     Pulses: Normal pulses.     Heart sounds: Normal heart sounds. No murmur heard.    No friction rub.  Pulmonary:     Effort: Pulmonary effort is normal.     Breath sounds: Normal breath sounds. No wheezing, rhonchi or rales.  Abdominal:     General: Bowel sounds are normal.     Palpations: Abdomen is soft.     Tenderness: There is no abdominal tenderness.  Musculoskeletal:        General: No deformity. Normal range of motion.  Lymphadenopathy:     Cervical: No cervical adenopathy.   Skin:    General: Skin is warm and dry.     Findings: No lesion.  Neurological:     General: No focal deficit present.     Mental Status: She is alert and oriented to person, place, and time.  Psychiatric:        Mood and Affect: Mood normal.        Thought Content: Thought content normal.       02/20/2024    8:55 AM 02/24/2023    3:04 PM  Depression screen PHQ 2/9  Decreased Interest 0 0  Down, Depressed, Hopeless 0 0  PHQ - 2 Score 0 0     No results found for any visits on 02/20/24.    Assessment & Plan:  Well adult exam -     Comprehensive metabolic panel with GFR -     CBC with Differential/Platelet -     TSH -     T4, free -     Hemoglobin A1c -     Lipid panel  Routine screening for STI (sexually transmitted infection) -     HIV Antibody (routine testing w rflx) -     RPR -     C. trachomatis/N. gonorrhoeae RNA   Age-appropriate health screenings discussed.  Will obtain labs.  Immunizations reviewed.  Pap done 05/23/2022.  Colonoscopy and mammogram not yet indicated.  Next CPE in 1 year.  Follow-up sooner for any other concerns.  Return in about 1 year (around 02/19/2025), or if symptoms worsen or fail to improve, for physical.   Deeann Saint, MD

## 2024-02-21 LAB — C. TRACHOMATIS/N. GONORRHOEAE RNA
C. trachomatis RNA, TMA: NOT DETECTED
N. gonorrhoeae RNA, TMA: NOT DETECTED

## 2024-02-21 LAB — RPR: RPR Ser Ql: NONREACTIVE

## 2024-02-21 LAB — HIV ANTIBODY (ROUTINE TESTING W REFLEX): HIV 1&2 Ab, 4th Generation: NONREACTIVE

## 2024-02-21 LAB — HEMOGLOBIN A1C: Hgb A1c MFr Bld: 4.5 % — ABNORMAL LOW (ref 4.6–6.5)

## 2024-03-01 ENCOUNTER — Encounter: Payer: Self-pay | Admitting: Family Medicine

## 2024-05-04 ENCOUNTER — Ambulatory Visit: Admitting: Obstetrics & Gynecology

## 2024-05-04 ENCOUNTER — Other Ambulatory Visit (HOSPITAL_COMMUNITY)
Admission: RE | Admit: 2024-05-04 | Discharge: 2024-05-04 | Disposition: A | Discharge status: Home / Self Care | Source: Ambulatory Visit | Attending: Obstetrics & Gynecology | Admitting: Obstetrics & Gynecology

## 2024-05-04 DIAGNOSIS — R102 Pelvic and perineal pain: Secondary | ICD-10-CM | POA: Diagnosis not present

## 2024-05-04 DIAGNOSIS — Z124 Encounter for screening for malignant neoplasm of cervix: Secondary | ICD-10-CM

## 2024-05-04 DIAGNOSIS — Z8742 Personal history of other diseases of the female genital tract: Secondary | ICD-10-CM | POA: Diagnosis not present

## 2024-05-04 NOTE — Progress Notes (Signed)
    GYNECOLOGY PROGRESS NOTE  Subjective:    Patient ID: Ariana Waters, female    DOB: [birth year], 38 y.o.   MRN: [id number]  HPI  Patient is a 38 y.o. [id number] who presents for bilateral pelvic pain for the last 3 days. She thinks that her period is due in about 10 days. She has been abstinent for at least a year. She has had STI testing since her last partner. She reports the pain is about a "5" and she has taken tylenol  only once or twice.   The following portions of the patient's history were reviewed and updated as appropriate: allergies, current medications, past family history, past medical history, past social history, past surgical history, and problem list.  Review of Systems Pertinent items are noted in HPI.  She had a LGSIL, cannot rule out HGSIL pap in 2020 and a normal one in 2023.  Objective:   There were no vitals taken for this visit. There is no height or weight on file to calculate BMI. General appearance: alert Abdomen: soft, non-tender; bowel sounds normal; no masses,  no organomegaly Pelvic: cervix normal in appearance, external genitalia normal, no adnexal masses or tenderness, no cervical motion tenderness, uterus normal size, shape, and consistency, and vagina normal without discharge Extremities: extremities normal, atraumatic, no cyanosis or edema Neurologic: Grossly normal   Assessment:   1. Screening for cervical cancer   2.      Pelvic pain  Plan:   1. Screening for cervical cancer (Primary) (with h/o cin)  - Cytology - PAP   2.  Pelvic ultrasound if pain persists after her NMP - We discussed ovarian cyst cycle and ovulation pain

## 2024-05-06 LAB — CYTOLOGY - PAP: Diagnosis: NEGATIVE

## 2025-02-21 ENCOUNTER — Encounter: Admitting: Family Medicine
